# Patient Record
Sex: Female | Born: 2000 | Race: Black or African American | Hispanic: No | Marital: Single | Smoking: Current every day smoker
Health system: Southern US, Community
[De-identification: ages and names within clinical notes are randomized; demographics above are authoritative.]

## PROBLEM LIST (undated history)

## (undated) DIAGNOSIS — N611 Abscess of the breast and nipple: Secondary | ICD-10-CM

## (undated) HISTORY — DX: Abscess of the breast and nipple: N61.1

---
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
## 2022-03-30 ENCOUNTER — Ambulatory Visit (LOCAL_COMMUNITY_HEALTH_CENTER): Payer: Medicaid Other | Admitting: Family Medicine

## 2022-03-30 VITALS — BP 129/74 | HR 97 | Ht 59.0 in | Wt 79.2 lb

## 2022-03-30 DIAGNOSIS — Z309 Encounter for contraceptive management, unspecified: Secondary | ICD-10-CM

## 2022-03-30 DIAGNOSIS — Z3009 Encounter for other general counseling and advice on contraception: Secondary | ICD-10-CM

## 2022-03-30 DIAGNOSIS — Z30013 Encounter for initial prescription of injectable contraceptive: Secondary | ICD-10-CM | POA: Diagnosis not present

## 2022-03-30 DIAGNOSIS — Z0000 Encounter for general adult medical examination without abnormal findings: Secondary | ICD-10-CM

## 2022-03-30 MED ORDER — MEDROXYPROGESTERONE ACETATE 150 MG/ML IM SUSP
150.0000 mg | INTRAMUSCULAR | Status: DC
  Administered 2022-03-30: 150 mg via INTRAMUSCULAR

## 2022-03-30 MED ORDER — MEDROXYPROGESTERONE ACETATE 150 MG/ML IM SUSP: 150.0000 mg | Freq: Once | INTRAMUSCULAR | Status: DC

## 2022-03-30 NOTE — Progress Notes (Signed)
Please see other note with Attestation of FNP documentation.

## 2022-03-30 NOTE — Progress Notes (Signed)
Pt appointment for PE and Depo. Family planning packet provided and contents reviewed. Seen by MD Alvester Morin and FNP Sydnee Levans. Depo administered by the providers.

## 2022-03-30 NOTE — Progress Notes (Addendum)
[facility] DEPARTMENT [facility] [address] Main Number: [phone]    Family Planning Visit- Physical  Subjective:  Rhonda Collins is a 21 y.o.  G0P0000   being seen today for an initial annual visit and to discuss reproductive life planning.  The patient is currently using Hormonal Injection for pregnancy prevention. Patient reports   does not want a pregnancy in the next year.     report they are looking for a method that provides High efficacy at preventing pregnancy  Patient has the following medical conditions has Breast abscess; Underweight BMI=15.6; Short stature; Marijuana use daily; and Nicotine vapor product user on their problem list.  Chief Complaint  Patient presents with   Contraception    PE and Depo    Patient reports to clinic today for PE and depo. Previously on depo but has not been on it since January. Reports unprotected sex yesterday. LMP 03/27/22.    Body mass index is 16 kg/m. - Patient is eligible for diabetes screening based on BMI and age >77?  not applicable HA1C ordered? not applicable  Patient reports 4  partner/s in last year. Desires STI screening?  No - declined.   Has patient been screened once for HCV in the past?  No  No results found for: "HCVAB"  Does the patient have current drug use (including MJ), have a partner with drug use, and/or has been incarcerated since last result? No If yes-- Screen for HCV through [facility]   Does the patient meet criteria for HBV testing? No.  Criteria:  -Household, sexual or needle sharing contact with HBV -History of drug use -HIV positive -Those with known Hep C   Health Maintenance Due  Topic Date Due   COVID-19 Vaccine (1) Never done   HPV VACCINES (1 - 2-dose series) Never done   CHLAMYDIA SCREENING  Never done   Hepatitis C Screening  Never done   TETANUS/TDAP  Never done   INFLUENZA VACCINE  Never done    Review of Systems  All  other systems reviewed and are negative.   The following portions of the patient's history were reviewed and updated as appropriate: allergies, current medications, past family history, past medical history, past social history, past surgical history and problem list. Problem list updated.  Denies any problems or concerns.  See flowsheet for other program required questions.  Objective:   Vitals:   03/30/22 1057  BP: 129/74  Pulse: 97  Weight: 79 lb 3.2 oz (35.9 kg)  Height: 4\' 11"  (1.499 m)    Physical Exam Neurological:     Mental Status: She is alert.  Psychiatric:        Mood and Affect: Affect is flat.   Declined physical exam. Breast exam not indicated. Declined vaginal exam and STI testing.   Assessment and Plan:  Rhonda Collins is a 21 y.o. female presenting to the [facility] Department for an annual wellness/contraceptive visit  1. Family planning Contraception counseling: Reviewed options based on patient desire and reproductive life plan. Patient is interested in Hormonal Injection. This was provided to the patient today.   Risks, benefits, and typical effectiveness rates were reviewed.  Questions were answered.  Written information was also given to the patient to review.    The patient will follow up in  1 years for surveillance.  The patient was told to call with any further questions, or with any concerns about this method of  contraception.  Emphasized use of condoms 100% of the time for STI prevention.  Need for ECP was assessed. Patient reported Unprotected sex within past 72 hours.  Reviewed options and patient declined wanting ECP today. Advised patient to take an at home PT in 2 weeks. Counseled that PT will be done at next depo appointment as backup.  - medroxyPROGESTERone (DEPO-PROVERA) injection 150 mg  2. Well woman exam without gynecological exam Patient declined vaginal exam. No concerns. Breast exam not indicated.  Flat affect  today. Denies depression or mental health concerns.       No future appointments.  Lenice Llamas, [place]

## 2022-03-30 NOTE — Addendum Note (Signed)
Addended by: Lenice Llamas on: 03/30/2022 03:52 PM   Modules accepted: Orders

## 2022-06-21 ENCOUNTER — Ambulatory Visit: Payer: Medicaid Other

## 2022-07-08 ENCOUNTER — Ambulatory Visit: Payer: Medicaid Other | Admitting: Family Medicine

## 2022-08-23 ENCOUNTER — Ambulatory Visit: Payer: Medicaid Other

## 2022-11-23 DIAGNOSIS — O26899 Other specified pregnancy related conditions, unspecified trimester: Secondary | ICD-10-CM | POA: Insufficient documentation

## 2022-11-23 DIAGNOSIS — R1013 Epigastric pain: Secondary | ICD-10-CM | POA: Insufficient documentation

## 2022-11-23 DIAGNOSIS — Z5329 Procedure and treatment not carried out because of patient's decision for other reasons: Secondary | ICD-10-CM | POA: Diagnosis not present

## 2022-11-23 DIAGNOSIS — Z3A00 Weeks of gestation of pregnancy not specified: Secondary | ICD-10-CM | POA: Insufficient documentation

## 2022-11-23 DIAGNOSIS — O219 Vomiting of pregnancy, unspecified: Secondary | ICD-10-CM | POA: Insufficient documentation

## 2022-11-23 LAB — CBC
HCT: 40.5 % (ref 36.0–46.0)
Hemoglobin: 13.5 g/dL (ref 12.0–15.0)
MCH: 26.5 pg (ref 26.0–34.0)
MCHC: 33.3 g/dL (ref 30.0–36.0)
MCV: 79.4 fL — ABNORMAL LOW (ref 80.0–100.0)
Platelets: 335 10*3/uL (ref 150–400)
RBC: 5.1 MIL/uL (ref 3.87–5.11)
RDW: 13.5 % (ref 11.5–15.5)
WBC: 9.4 10*3/uL (ref 4.0–10.5)
nRBC: 0 % (ref 0.0–0.2)

## 2022-11-23 NOTE — ED Notes (Signed)
First Nurse note: pt BIB ems with c/o n/v since lunch time today.  Pt has been vomiting since eating chicken w/BBQ sauce today.  Pt otherwise ambulatory on request, and is A&O x4.    4mg  ODT zofran w/ems VSS w/ems

## 2022-11-23 NOTE — ED Triage Notes (Signed)
Pt states that she has been having n/v all day, upper abd pain with CP and SOB

## 2022-11-24 ENCOUNTER — Emergency Department
Admission: EM | Admit: 2022-11-24 | Discharge: 2022-11-24 | Disposition: A | Discharge status: Home / Self Care | Payer: Medicaid Other | Attending: Emergency Medicine | Admitting: Emergency Medicine

## 2022-11-24 DIAGNOSIS — R112 Nausea with vomiting, unspecified: Secondary | ICD-10-CM

## 2022-11-24 DIAGNOSIS — R1013 Epigastric pain: Secondary | ICD-10-CM

## 2022-11-24 DIAGNOSIS — Z3490 Encounter for supervision of normal pregnancy, unspecified, unspecified trimester: Secondary | ICD-10-CM

## 2022-11-24 LAB — COMPREHENSIVE METABOLIC PANEL
ALT: 15 U/L (ref 0–44)
AST: 28 U/L (ref 15–41)
Albumin: 4.6 g/dL (ref 3.5–5.0)
Alkaline Phosphatase: 70 U/L (ref 38–126)
Anion gap: 12 (ref 5–15)
BUN: 14 mg/dL (ref 6–20)
CO2: 20 mmol/L — ABNORMAL LOW (ref 22–32)
Calcium: 9.8 mg/dL (ref 8.9–10.3)
Chloride: 102 mmol/L (ref 98–111)
Creatinine, Ser: 0.72 mg/dL (ref 0.44–1.00)
GFR, Estimated: >60 mL/min (ref >60)
Glucose, Bld: 128 mg/dL — ABNORMAL HIGH (ref 70–99)
Potassium: 3.7 mmol/L (ref 3.5–5.1)
Sodium: 134 mmol/L — ABNORMAL LOW (ref 135–145)
Total Bilirubin: 1.1 mg/dL (ref 0.3–1.2)
Total Protein: 8.1 g/dL (ref 6.5–8.1)

## 2022-11-24 LAB — URINALYSIS, ROUTINE W REFLEX MICROSCOPIC
Bacteria, UA: NONE SEEN
Bilirubin Urine: NEGATIVE
Glucose, UA: NEGATIVE mg/dL
Hgb urine dipstick: NEGATIVE
Ketones, ur: 80 mg/dL — AB
Leukocytes,Ua: NEGATIVE
Nitrite: NEGATIVE
Protein, ur: 100 mg/dL — AB
Specific Gravity, Urine: 1.024 (ref 1.005–1.030)
pH: 6 (ref 5.0–8.0)

## 2022-11-24 LAB — LIPASE, BLOOD: Lipase: 23 U/L (ref 11–51)

## 2022-11-24 LAB — HCG, QUANTITATIVE, PREGNANCY: hCG, Beta Chain, Quant, S: 7691 m[IU]/mL — ABNORMAL HIGH (ref <5)

## 2022-11-24 LAB — PREGNANCY, URINE: Preg Test, Ur: POSITIVE — AB

## 2022-11-24 MED ORDER — VITAMIN B-6 25 MG PO TABS: 25.0000 mg | ORAL_TABLET | Freq: Four times a day (QID) | ORAL | 0 refills | Status: AC | PRN

## 2022-11-24 MED ORDER — SODIUM CHLORIDE 0.9 % IV BOLUS: 500.0000 mL | Freq: Once | INTRAVENOUS | Status: DC

## 2022-11-24 MED ORDER — ONDANSETRON 4 MG PO TBDP: 4.0000 mg | ORAL_TABLET | Freq: Three times a day (TID) | ORAL | 0 refills | Status: DC | PRN

## 2022-11-24 MED ORDER — FAMOTIDINE IN NACL 20-0.9 MG/50ML-% IV SOLN
20.0000 mg | Freq: Once | INTRAVENOUS | Status: AC
  Administered 2022-11-24: 20 mg via INTRAVENOUS
  Filled 2022-11-24: qty 50

## 2022-11-24 MED ORDER — ONDANSETRON HCL 4 MG/2ML IJ SOLN
4.0000 mg | Freq: Once | INTRAMUSCULAR | Status: AC
  Administered 2022-11-24: 4 mg via INTRAVENOUS
  Filled 2022-11-24: qty 2

## 2022-11-24 MED ORDER — METOCLOPRAMIDE HCL 5 MG/ML IJ SOLN
10.0000 mg | Freq: Once | INTRAMUSCULAR | Status: AC
  Administered 2022-11-24: 10 mg via INTRAVENOUS
  Filled 2022-11-24: qty 2

## 2022-11-24 MED ORDER — DIPHENHYDRAMINE HCL (SLEEP) 25 MG PO TBDP: 25.0000 mg | ORAL_TABLET | Freq: Every evening | ORAL | 0 refills | Status: DC | PRN

## 2022-11-24 MED ORDER — FAMOTIDINE 20 MG PO TABS: 20.0000 mg | ORAL_TABLET | Freq: Every day | ORAL | 0 refills | Status: DC

## 2022-11-24 MED ORDER — SODIUM CHLORIDE 0.9 % IV BOLUS
1000.0000 mL | Freq: Once | INTRAVENOUS | Status: AC
  Administered 2022-11-24: 1000 mL via INTRAVENOUS

## 2022-11-24 NOTE — ED Provider Notes (Signed)
Care assumed of patient from outgoing provider.  See their note for initial history, exam and plan.  Clinical Course as of 11/24/22 0748  Wed Nov 24, 2022  0715 Presents to the emergency department with epigastric pain associated with nausea and vomiting.  History of daily marijuana use but states she no longer has daily marijuana use.  Of multiple episodes of vomiting and no diarrhea.  Given multiple rounds of IV antiemetics and IV fluids.  Labs reassuring.  On reevaluation patient tolerating p.o.  Does not want to eat any graham crackers and states that she will eat whenever she gets home.  Drinking water in the emergency department with no further episodes of emesis.    Pregnancy test came back positive.  Discussed with the patient concern for possible ectopic pregnancy.  Patient said she did not want to stay for further workup.  She was going to leave AGAINST MEDICAL ADVICE.  Was able to state concerns of ectopic pregnancy and pregnancy in the incorrect location.  States that she is going to follow-up with the health department.  Attempted multiple times to discuss staying in the emergency department for further workup.  She expressed understanding and ultimately decided to leave AGAINST MEDICAL ADVICE.  Given return precautions and information to follow-up with the health department. [SM]    Clinical Course User Index [SM] Corena Herter, MD     Corena Herter, MD 11/24/22 [phone]

## 2022-11-24 NOTE — ED Notes (Signed)
Patient actively vomiting at this time. 

## 2022-11-24 NOTE — ED Notes (Signed)
Patient continues to having nausea.  Updated on plan of care

## 2022-11-24 NOTE — ED Notes (Signed)
Patient reports she is unable to provide a urine sample at this time.  States she does not need to urinate.  After speaking to RN, patient covered head with blanket and went back to sleep

## 2022-11-24 NOTE — ED Notes (Signed)
I witnessed Dr Arnoldo Morale explain the risks of not staying for an ultrasound to identify if pt has intrauterine pregnancy. Dr. Arnoldo Morale thoroughly explained risks and outcome of undiagnosed ectopic pregnancy, to include death. Dr. Arnoldo Morale had pt explain back those risks and outcome. Pt still refusing to stay for additional workup.

## 2022-11-24 NOTE — ED Provider Notes (Signed)
[facility] Provider Note    Event Date/Time   First MD Initiated Contact with Patient 11/24/22 0201     (approximate)   History   Nausea   HPI  Rhonda Collins is a 22 y.o. female with no significant PMH who presents with nausea and vomiting since last night, multiple episodes, nonbloody, associated with burning pain to her chest and upper abdomen.  The patient denies any diarrhea.  She denies eating anything out of the ordinary and has not traveled.  She has no sick contacts.  The patient denies any lower abdominal pain.  She has no fever or chills.  I reviewed the past medical records.  The patient's most recent outpatient encounter was on 11/14 of last year with the health department for reproductive life planning.  Physical Exam   Triage Vital Signs: ED Triage Vitals [11/23/22 2315]  Enc Vitals Group     BP 138/85     Pulse Rate (!) 104     Resp 20     Temp 97.7 F (36.5 C)     Temp Source Oral     SpO2 100 %     Weight      Height      Head Circumference      Peak Flow      Pain Score 10     Pain Loc      Pain Edu?      Excl. in GC?     Most recent vital signs: Vitals:   11/23/22 2315 11/24/22 0615  BP: 138/85 123/81  Pulse: (!) 104 100  Resp: 20 15  Temp: 97.7 F (36.5 C) 98.3 F (36.8 C)  SpO2: 100% 100%     General: Awake, no distress.  CV:  Good peripheral perfusion.  Resp:  Normal effort.  Abd:  Soft and nontender.  No distention.  Other:  No jaundice or scleral icterus.   ED Results / Procedures / Treatments   Labs (all labs ordered are listed, but only abnormal results are displayed) Labs Reviewed  COMPREHENSIVE METABOLIC PANEL - Abnormal; Notable for the following components:      Result Value   Sodium 134 (*)    CO2 20 (*)    Glucose, Bld 128 (*)    All other components within normal limits  CBC - Abnormal; Notable for the following components:   MCV 79.4 (*)    All other components within normal  limits  LIPASE, BLOOD  URINALYSIS, ROUTINE W REFLEX MICROSCOPIC  POC URINE PREG, ED     EKG  ED ECG REPORT I, Dionne Bucy, the attending physician, personally viewed and interpreted this ECG.  Date: 11/23/2022 EKG Time: 2327 Rate: 104 Rhythm: normal sinus rhythm QRS Axis: Right axis Intervals: normal ST/T Wave abnormalities: normal Narrative Interpretation: no evidence of acute ischemia; no prior EKG available for comparison    RADIOLOGY    PROCEDURES:  Critical Care performed: No  Procedures   MEDICATIONS ORDERED IN ED: Medications  sodium chloride 0.9 % bolus 1,000 mL (0 mLs Intravenous Stopped 11/24/22 0613)  ondansetron (ZOFRAN) injection 4 mg (4 mg Intravenous Given 11/24/22 0349)  famotidine (PEPCID) IVPB 20 mg premix (0 mg Intravenous Stopped 11/24/22 0612)  metoCLOPramide (REGLAN) injection 10 mg (10 mg Intravenous Given 11/24/22 0654)     IMPRESSION / MDM / ASSESSMENT AND PLAN / ED COURSE  I reviewed the triage vital signs and the nursing notes.  22 year old female with no significant PMH presents  with nausea and vomiting since last night.  The patient also reports upper abdominal and chest pain.  On exam her vital signs are normal except for borderline tachycardia.  The abdomen is soft and nontender.  Differential diagnosis includes, but is not limited to, gastroenteritis, foodborne illness, gastritis, biliary colic, pancreatitis, other hepatobiliary etiology.  Chest pain is consistent with vomiting/stomach acid.  There is no evidence of cardiac etiology or PE.  Patient's presentation is most consistent with acute complicated illness / injury requiring diagnostic workup.  Lab workup is reassuring.  There is no leukocytosis.  LFTs and lipase are normal.  We will give fluids, Zofran, Pepcid for symptomatic treatment and reassess.  ----------------------------------------- 7:06 AM on 11/24/2022 -----------------------------------------   The patient  is feeling somewhat better.  She states that the epigastric and chest pain has completely resolved.  However, she is still nauseous and intermittently dry heaving.  She has not been able to tolerate any p.o.  I have ordered her a dose of Reglan.  I signed her out to the oncoming ED physician Dr. Arnoldo Morale.     FINAL CLINICAL IMPRESSION(S) / ED DIAGNOSES   Final diagnoses:  Nausea and vomiting, unspecified vomiting type  Epigastric pain     Rx / DC Orders   ED Discharge Orders     None        Note:  This document was prepared using Dragon voice recognition software and may include unintentional dictation errors.    Dionne Bucy, MD 11/24/22 [phone]

## 2022-11-24 NOTE — ED Notes (Signed)
Pt asking for phone to call for a ride home. Pt wishing to leave. Dr Arnoldo Morale aware. Pt given phone to call parents. Dr Arnoldo Morale gave pts PO fluids and crackers for PO challenge. Pt reports her nausea has resolved.

## 2022-11-24 NOTE — Discharge Instructions (Addendum)
You are seen in the emergency department with abdominal pain and nausea and vomiting.  You had a pregnancy test that is positive.  It was recommended that you stay in the emergency department to get an ultrasound done to confirm where your pregnancy is located.  If your pregnancy is outside of your uterus you could have something called an ectopic pregnancy which can seriously injure you and result in death if it ruptures.  You did not want to stay for further workup and instead chose to leave AGAINST MEDICAL ADVICE.  If at any point you want to return to the emergency department to complete your workup, please return and we will help complete this workup.  If you have return of abdominal pain it is importantly return to the emergency department.  You are given a prescription for nausea medicines.  You are given information to follow-up with the health department.

## 2022-11-25 ENCOUNTER — Emergency Department: Payer: Medicaid Other

## 2022-11-25 ENCOUNTER — Telehealth: Payer: Self-pay | Admitting: Family Medicine

## 2022-11-25 ENCOUNTER — Emergency Department
Admission: EM | Admit: 2022-11-25 | Discharge: 2022-11-25 | Disposition: A | Discharge status: Home / Self Care | Payer: Medicaid Other | Attending: Student in an Organized Health Care Education/Training Program | Admitting: Student in an Organized Health Care Education/Training Program

## 2022-11-25 ENCOUNTER — Encounter: Payer: Self-pay | Admitting: Emergency Medicine

## 2022-11-25 ENCOUNTER — Other Ambulatory Visit: Payer: Self-pay

## 2022-11-25 DIAGNOSIS — R102 Pelvic and perineal pain: Secondary | ICD-10-CM

## 2022-11-25 DIAGNOSIS — O210 Mild hyperemesis gravidarum: Secondary | ICD-10-CM | POA: Insufficient documentation

## 2022-11-25 DIAGNOSIS — O26891 Other specified pregnancy related conditions, first trimester: Secondary | ICD-10-CM | POA: Diagnosis present

## 2022-11-25 DIAGNOSIS — O209 Hemorrhage in early pregnancy, unspecified: Secondary | ICD-10-CM

## 2022-11-25 DIAGNOSIS — Z3A01 Less than 8 weeks gestation of pregnancy: Secondary | ICD-10-CM | POA: Diagnosis not present

## 2022-11-25 LAB — CBC
HCT: 42.4 % (ref 36.0–46.0)
Hemoglobin: 14 g/dL (ref 12.0–15.0)
MCH: 26.4 pg (ref 26.0–34.0)
MCHC: 33 g/dL (ref 30.0–36.0)
MCV: 80 fL (ref 80.0–100.0)
Platelets: 356 10*3/uL (ref 150–400)
RBC: 5.3 MIL/uL — ABNORMAL HIGH (ref 3.87–5.11)
RDW: 13.8 % (ref 11.5–15.5)
WBC: 13 10*3/uL — ABNORMAL HIGH (ref 4.0–10.5)
nRBC: 0 % (ref 0.0–0.2)

## 2022-11-25 LAB — COMPREHENSIVE METABOLIC PANEL
ALT: 16 U/L (ref 0–44)
AST: 27 U/L (ref 15–41)
Albumin: 4.8 g/dL (ref 3.5–5.0)
Alkaline Phosphatase: 70 U/L (ref 38–126)
Anion gap: 12 (ref 5–15)
BUN: 18 mg/dL (ref 6–20)
CO2: 22 mmol/L (ref 22–32)
Calcium: 9.7 mg/dL (ref 8.9–10.3)
Chloride: 102 mmol/L (ref 98–111)
Creatinine, Ser: 0.71 mg/dL (ref 0.44–1.00)
GFR, Estimated: >60 mL/min (ref >60)
Glucose, Bld: 105 mg/dL — ABNORMAL HIGH (ref 70–99)
Potassium: 3.3 mmol/L — ABNORMAL LOW (ref 3.5–5.1)
Sodium: 136 mmol/L (ref 135–145)
Total Bilirubin: 1 mg/dL (ref 0.3–1.2)
Total Protein: 8.5 g/dL — ABNORMAL HIGH (ref 6.5–8.1)

## 2022-11-25 LAB — HCG, QUANTITATIVE, PREGNANCY: hCG, Beta Chain, Quant, S: 11687 m[IU]/mL — ABNORMAL HIGH (ref <5)

## 2022-11-25 LAB — LIPASE, BLOOD: Lipase: 24 U/L (ref 11–51)

## 2022-11-25 MED ORDER — COMPLETENATE 29-1 MG PO CHEW: 1.0000 | CHEWABLE_TABLET | Freq: Every day | ORAL | 3 refills | Status: AC

## 2022-11-25 MED ORDER — ONDANSETRON 4 MG PO TBDP: 4.0000 mg | ORAL_TABLET | Freq: Three times a day (TID) | ORAL | 0 refills | Status: DC | PRN

## 2022-11-25 MED ORDER — ONDANSETRON 4 MG PO TBDP
4.0000 mg | ORAL_TABLET | Freq: Once | ORAL | Status: AC
  Administered 2022-11-25: 4 mg via ORAL
  Filled 2022-11-25: qty 1

## 2022-11-25 NOTE — Telephone Encounter (Signed)
Call to client to refer her to ED as currently has not established maternity care at [facility]. Chart review indicates currently at the [facility] ED. [facility] clerical scheduled her for UPT tomorrow, but this appt was cancelled as client has positive UPT from [facility] ED yesterday. Left message regarding above on client's voicemail. Also left message that if needed a prenatal care appt to call [phone]. Jossie Ng, RN

## 2022-11-25 NOTE — Telephone Encounter (Signed)
Pt said she went to the hospital + found out she was pregnant but has no documents from the visit. I put her in for a pregnancy test for tomorrow but she stated she's in a lot of pain + doesn't know whether she needs to go back to the emergency room. I let her know I wasn't able to speak on that but would put in a phone note in.

## 2022-11-25 NOTE — ED Provider Notes (Signed)
[facility] Emergency Department Provider Note     Event Date/Time   First MD Initiated Contact with Patient 11/25/22 1137     (approximate)   History   Abdominal Pain   HPI  Rhonda Collins is a 22 y.o. female G1P0, returns to the ED for further evaluation of abdominal pain.  Patient was evaluated in the ED yesterday, left AMA due to being hungry.  Patient declined ultrasound yesterday to confirm that she had an IUP versus an ectopic pregnancy.  She is unclear of the current gestation of her pregnancy.  She denies any vaginal bleeding or pregnancy related complaints at this time.  Physical Exam   Triage Vital Signs: ED Triage Vitals  Encounter Vitals Group     BP 11/25/22 1058 (!) 124/91     Systolic BP Percentile --      Diastolic BP Percentile --      Pulse Rate 11/25/22 1058 98     Resp 11/25/22 1058 18     Temp 11/25/22 1058 98.3 F (36.8 C)     Temp Source 11/25/22 1058 Oral     SpO2 11/25/22 1058 94 %     Weight --      Height --      Head Circumference --      Peak Flow --      Pain Score 11/25/22 1059 10     Pain Loc --      Pain Education --      Exclude from Growth Chart --     Most recent vital signs: Vitals:   11/25/22 1058 11/25/22 1334  BP: (!) 124/91 131/83  Pulse: 98 88  Resp: 18 17  Temp: 98.3 F (36.8 C)   SpO2: 94% 100%    General Awake, no distress. NAD CV:  Good peripheral perfusion.  RESP:  Normal effort.  ABD:  No distention.  GU:  deferred   ED Results / Procedures / Treatments   Labs (all labs ordered are listed, but only abnormal results are displayed) Labs Reviewed  COMPREHENSIVE METABOLIC PANEL - Abnormal; Notable for the following components:      Result Value   Potassium 3.3 (*)    Glucose, Bld 105 (*)    Total Protein 8.5 (*)    All other components within normal limits  CBC - Abnormal; Notable for the following components:   WBC 13.0 (*)    RBC 5.30 (*)    All other components  within normal limits  HCG, QUANTITATIVE, PREGNANCY - Abnormal; Notable for the following components:   hCG, Beta Chain, Quant, S 11,687 (*)    All other components within normal limits  LIPASE, BLOOD     EKG    RADIOLOGY  I personally viewed and evaluated these images as part of my medical decision making, as well as reviewing the written report by the radiologist.  ED Provider Interpretation: single IUP gestational sac and yolk sac. No fetal pole  US OB LESS THAN 14 WEEKS WITH OB TRANSVAGINAL  Result Date: 11/25/2022 CLINICAL DATA:  Vaginal bleeding and pelvic pain EXAM: OBSTETRIC <14 WK [place] AND TRANSVAGINAL OB US TECHNIQUE: Both transabdominal and transvaginal ultrasound examinations were performed for complete evaluation of the gestation as well as the maternal uterus, adnexal regions, and pelvic cul-de-sac. Transvaginal technique was performed to assess early pregnancy. COMPARISON:  None Available. FINDINGS: Intrauterine gestational sac: Single Yolk sac:  Visualized. Embryo:  Not Visualized. Cardiac Activity: Not Visualized. Heart  Rate: NA MSD: 7.6 mm   5 w   4 d CRL: N/A Subchorionic hemorrhage:  None visualized. Maternal uterus/adnexae: Normal right ovary contains the corpus luteum. Left ovary is not seen. Small volume pelvic free fluid. IMPRESSION: Probable early intrauterine gestational sac and yolk sac, but no fetal pole or cardiac activity yet visualized. Recommend follow-up quantitative B-HCG levels and follow-up US in 14 days to assess viability. This recommendation follows SRU consensus guidelines: Diagnostic Criteria for Nonviable Pregnancy Early in the First Trimester. Malva Limes Med 2013; 161:0960-45. Electronically Signed   By: Agustin Cree M.D.   On: 11/25/2022 13:01     PROCEDURES:  Critical Care performed: No  Procedures   MEDICATIONS ORDERED IN ED: Medications  ondansetron (ZOFRAN-ODT) disintegrating tablet 4 mg (4 mg Oral Given 11/25/22 1344)     IMPRESSION / MDM /  ASSESSMENT AND PLAN / ED COURSE  I reviewed the triage vital signs and the nursing notes.                              Differential diagnosis includes, but is not limited to, ovarian cyst, ovarian torsion, acute appendicitis, diverticulitis, urinary tract infection/pyelonephritis, bowel obstruction, colitis, renal colic, gastroenteritis, hernia, fibroids, endometriosis, pregnancy related pain including ectopic pregnancy, etc.  Patient's presentation is most consistent with acute complicated illness / injury requiring diagnostic workup.  Patient reported to the ED to complete a visit she began yesterday, before she left AMA.  Patient presents in no acute distress, and concern with ongoing nausea and intermittent emesis.  Was evaluated for complaints in the ED, found to have reassuring exam and workup overall.  Beta quant is greater than 11,000.  No signs of acute leukocytosis or critical anemia.  Patient was unable to provide a urine sample today, but UA from yesterday did reveal ketonuria/proteinuria without bacteriuria.  Patient's diagnosis is consistent with early single IUP with sac w/o fetal pole. Patient will be discharged home with prescriptions for Zofran and Prenatal MVI. Patient is to follow up with [facility] OB as needed or otherwise directed. Patient is given ED precautions to return to the ED for any worsening or new symptoms.  Clinical Course as of 11/25/22 1902  Thu Nov 25, 2022  1336 HCG, Newman Nickels(!): [phone] [JM]    Clinical Course User Index [JM] Lineth Thielke, Charlesetta Ivory, PA-C    FINAL CLINICAL IMPRESSION(S) / ED DIAGNOSES   Final diagnoses:  Hyperemesis affecting pregnancy, antepartum  Less than [redacted] weeks gestation of pregnancy     Rx / DC Orders   ED Discharge Orders          Ordered    ondansetron (ZOFRAN-ODT) 4 MG disintegrating tablet  Every 8 hours PRN        11/25/22 1344    prenatal vitamin w/FE, FA (NATACHEW) 29-1 MG CHEW chewable tablet  Daily         11/25/22 1344             Note:  This document was prepared using Dragon voice recognition software and may include unintentional dictation errors.    Lissa Hoard, PA-C 11/25/22 1903    Willy Eddy, MD 11/27/22 1224

## 2022-11-25 NOTE — ED Notes (Signed)
Pt reports upper and lower medial abdominal "pressure"; pt reports nausea; reports yesterday was told she is pregnant; pt reports appetite is good but afraid to eat because she doesn't want to get sick and vomit again. Pt's skin dry, resp reg/unlabored and laying calmly on stretcher with blanket. Visitor at bedside.

## 2022-11-25 NOTE — Discharge Instructions (Addendum)
Your exam and labs overall reassuring.  Your ultrasound confirms a complete pregnancy measuring approximately 5 weeks.  You should follow-up with your OB provider for ongoing blood pregnancy hormone levels and repeat ultrasound in 14 days.  Take the prenatal vitamins and antinausea medicine as directed.

## 2022-11-25 NOTE — ED Notes (Signed)
Lavender, light green, and pink top sent to lab at this time.

## 2022-11-25 NOTE — ED Triage Notes (Signed)
Patient to ED via [facility] for abd pain. States she is pregnant but unsure how far along. States she left AMA due to being hungry yesterday.

## 2022-11-30 ENCOUNTER — Other Ambulatory Visit: Payer: Self-pay

## 2022-11-30 ENCOUNTER — Emergency Department
Admission: EM | Admit: 2022-11-30 | Discharge: 2022-11-30 | Disposition: A | Discharge status: Home / Self Care | Payer: Medicaid Other | Attending: Emergency Medicine | Admitting: Emergency Medicine

## 2022-11-30 ENCOUNTER — Encounter: Payer: Self-pay | Admitting: Emergency Medicine

## 2022-11-30 DIAGNOSIS — O209 Hemorrhage in early pregnancy, unspecified: Secondary | ICD-10-CM | POA: Diagnosis not present

## 2022-11-30 DIAGNOSIS — Z3A00 Weeks of gestation of pregnancy not specified: Secondary | ICD-10-CM | POA: Insufficient documentation

## 2022-11-30 DIAGNOSIS — O4690 Antepartum hemorrhage, unspecified, unspecified trimester: Secondary | ICD-10-CM

## 2022-11-30 LAB — CHLAMYDIA/NGC RT PCR (ARMC ONLY)
Chlamydia Tr: NOT DETECTED
N gonorrhoeae: NOT DETECTED

## 2022-11-30 LAB — URINALYSIS, ROUTINE W REFLEX MICROSCOPIC
Bilirubin Urine: NEGATIVE
Glucose, UA: NEGATIVE mg/dL
Ketones, ur: 20 mg/dL — AB
Nitrite: NEGATIVE
Protein, ur: 30 mg/dL — AB
Specific Gravity, Urine: 1.027 (ref 1.005–1.030)
pH: 6 (ref 5.0–8.0)

## 2022-11-30 LAB — BASIC METABOLIC PANEL
Anion gap: 9 (ref 5–15)
BUN: 10 mg/dL (ref 6–20)
CO2: 23 mmol/L (ref 22–32)
Calcium: 8.7 mg/dL — ABNORMAL LOW (ref 8.9–10.3)
Chloride: 99 mmol/L (ref 98–111)
Creatinine, Ser: 0.77 mg/dL (ref 0.44–1.00)
GFR, Estimated: >60 mL/min (ref >60)
Glucose, Bld: 100 mg/dL — ABNORMAL HIGH (ref 70–99)
Potassium: 3.4 mmol/L — ABNORMAL LOW (ref 3.5–5.1)
Sodium: 131 mmol/L — ABNORMAL LOW (ref 135–145)

## 2022-11-30 LAB — CBC
HCT: 38.3 % (ref 36.0–46.0)
Hemoglobin: 13 g/dL (ref 12.0–15.0)
MCH: 26.8 pg (ref 26.0–34.0)
MCHC: 33.9 g/dL (ref 30.0–36.0)
MCV: 79 fL — ABNORMAL LOW (ref 80.0–100.0)
Platelets: 299 10*3/uL (ref 150–400)
RBC: 4.85 MIL/uL (ref 3.87–5.11)
RDW: 13 % (ref 11.5–15.5)
WBC: 10.8 10*3/uL — ABNORMAL HIGH (ref 4.0–10.5)
nRBC: 0 % (ref 0.0–0.2)

## 2022-11-30 LAB — WET PREP, GENITAL
Clue Cells Wet Prep HPF POC: NONE SEEN
Sperm: NONE SEEN
Trich, Wet Prep: NONE SEEN
WBC, Wet Prep HPF POC: >=10 — AB (ref <10)
Yeast Wet Prep HPF POC: NONE SEEN

## 2022-11-30 LAB — HCG, QUANTITATIVE, PREGNANCY: hCG, Beta Chain, Quant, S: 47267 m[IU]/mL — ABNORMAL HIGH (ref <5)

## 2022-11-30 NOTE — ED Triage Notes (Signed)
Patient to ED via [facility] for vaginal bleeding. Patient states she went to the restroom around 4pm and notice brown colored discharge/blood when she wiped. Approx 5 weeks pregnancy. Seen here on 7/11.

## 2022-11-30 NOTE — Discharge Instructions (Addendum)
Your exam and labs are again normal and reassuring.  Your beta Sharene Butters is now greater than 47,000.  You should follow-up with [facility] for repeat ultrasound in 7 to 10 days.  Return to the ED for bright red bleeding, pelvic cramping, or uncontrolled nausea/vomiting.

## 2022-11-30 NOTE — ED Provider Notes (Signed)
[facility] Emergency Department Provider Note     Event Date/Time   First MD Initiated Contact with Patient 11/30/22 1720     (approximate)   History   Vaginal Bleeding   HPI  Rhonda Collins is a 22 y.o. female G1P0 returns to the ED for evaluation of some scant brown vaginal discharge reported this afternoon at about 4 PM.  Was while she was going to the restroom she noted the discharge.  Patient denies any other complaints including nausea, vomiting, diarrhea.  She denies any pelvic cramping, dysuria or bright red blood per vagina.   Physical Exam   Triage Vital Signs: ED Triage Vitals [11/30/22 1709]  Encounter Vitals Group     BP 139/75     Systolic BP Percentile      Diastolic BP Percentile      Pulse Rate 88     Resp 18     Temp 98.5 F (36.9 C)     Temp Source Oral     SpO2 100 %     Weight 72 lb (32.7 kg)     Height 4\' 11"  (1.499 m)     Head Circumference      Peak Flow      Pain Score 0     Pain Loc      Pain Education      Exclude from Growth Chart     Most recent vital signs: Vitals:   11/30/22 1709  BP: 139/75  Pulse: 88  Resp: 18  Temp: 98.5 F (36.9 C)  SpO2: 100%    General Awake, no distress. NAD CV:  Good peripheral perfusion.  RESP:  Normal effort.  ABD:  No distention.  GU:  Normal external genitalia.  Cervix is close with a scant brownish discharge noted in the vault and at the os.  No CVA tenderness or adnexal masses appreciated.   ED Results / Procedures / Treatments   Labs (all labs ordered are listed, but only abnormal results are displayed) Labs Reviewed  WET PREP, GENITAL - Abnormal; Notable for the following components:      Result Value   WBC, Wet Prep HPF POC >=10 (*)    All other components within normal limits  CBC - Abnormal; Notable for the following components:   WBC 10.8 (*)    MCV 79.0 (*)    All other components within normal limits  BASIC METABOLIC PANEL - Abnormal;  Notable for the following components:   Sodium 131 (*)    Potassium 3.4 (*)    Glucose, Bld 100 (*)    Calcium 8.7 (*)    All other components within normal limits  HCG, QUANTITATIVE, PREGNANCY - Abnormal; Notable for the following components:   hCG, Beta Chain, Quant, S 47,267 (*)    All other components within normal limits  CHLAMYDIA/NGC RT PCR ([facility] ONLY)            URINALYSIS, ROUTINE W REFLEX MICROSCOPIC  POC URINE PREG, ED     EKG   RADIOLOGY  No results found.   PROCEDURES:  Critical Care performed: No  Procedures   MEDICATIONS ORDERED IN ED: Medications - No data to display   IMPRESSION / MDM / ASSESSMENT AND PLAN / ED COURSE  I reviewed the triage vital signs and the nursing notes.  Differential diagnosis includes, but is not limited to, threatened miscarriage, incomplete miscarriage, normal bleeding from an early trimester pregnancy, ectopic pregnancy, , blighted ovum, vaginal/cervical trauma, subchorionic hemorrhage/hematoma, etc.  Patient's presentation is most consistent with acute complicated illness / injury requiring diagnostic workup.  Patient's diagnosis is consistent with stable IUP with reassuring quant at greater than 47,000.  Patient with only scant brown discharge noted per vagina.  Her pelvic exam is also reassuring as it shows closed cervical os.  Patient will be discharged home with actions to continue with her prenatal vitamin.  Patient is to follow up with [facility] as scheduled, as needed or otherwise directed. Patient is given ED precautions to return to the ED for any worsening or new symptoms.  Again reminded that she needs a repeat ultrasound in 7 to 10 days to confirm progression of the pregnancy however, her increased quant indicates a well developing IUP.   FINAL CLINICAL IMPRESSION(S) / ED DIAGNOSES   Final diagnoses:  Vaginal bleeding in pregnancy     Rx / DC Orders   ED Discharge Orders     None         Note:  This document was prepared using Dragon voice recognition software and may include unintentional dictation errors.    Lissa Hoard, PA-C 11/30/22 2016    Trinna Post, MD 12/06/22 [phone]

## 2022-12-29 ENCOUNTER — Telehealth: Payer: Self-pay

## 2022-12-29 NOTE — Telephone Encounter (Signed)
Phone call to patient to assess for receiving prenatal care at another provider due to RN researching chart for scheduled new OB appt here on 12/30/22. Per female answering phone which did not identify herself stated "she isn't with me right now." RN needed to inform patient to schedule or continue [facility] at current provider due to per 12/23/22 [place] patient has a twin pregnancy. RN requested and provided call back number for patient to return our call. Tawny Hopping, RN

## 2023-01-10 DIAGNOSIS — O0992 Supervision of high risk pregnancy, unspecified, second trimester: Secondary | ICD-10-CM | POA: Insufficient documentation

## 2023-01-18 ENCOUNTER — Telehealth: Payer: Self-pay

## 2023-01-18 ENCOUNTER — Other Ambulatory Visit: Payer: Self-pay | Admitting: Certified Nurse Midwife

## 2023-01-18 DIAGNOSIS — Z363 Encounter for antenatal screening for malformations: Secondary | ICD-10-CM

## 2023-01-26 ENCOUNTER — Telehealth: Payer: Self-pay

## 2023-02-08 DIAGNOSIS — O30032 Twin pregnancy, monochorionic/diamniotic, second trimester: Secondary | ICD-10-CM | POA: Diagnosis present

## 2023-02-25 DIAGNOSIS — O30009 Twin pregnancy, unspecified number of placenta and unspecified number of amniotic sacs, unspecified trimester: Secondary | ICD-10-CM | POA: Insufficient documentation

## 2023-02-28 ENCOUNTER — Emergency Department: Payer: Medicaid Other

## 2023-02-28 ENCOUNTER — Observation Stay
Admission: EM | Admit: 2023-02-28 | Discharge: 2023-03-02 | Disposition: A | Discharge status: Home / Self Care | Payer: Medicaid Other

## 2023-02-28 ENCOUNTER — Other Ambulatory Visit: Payer: Self-pay

## 2023-02-28 DIAGNOSIS — O99332 Smoking (tobacco) complicating pregnancy, second trimester: Secondary | ICD-10-CM | POA: Insufficient documentation

## 2023-02-28 DIAGNOSIS — O23592 Infection of other part of genital tract in pregnancy, second trimester: Secondary | ICD-10-CM | POA: Insufficient documentation

## 2023-02-28 DIAGNOSIS — Z3A19 19 weeks gestation of pregnancy: Secondary | ICD-10-CM | POA: Diagnosis not present

## 2023-02-28 DIAGNOSIS — Z79899 Other long term (current) drug therapy: Secondary | ICD-10-CM | POA: Insufficient documentation

## 2023-02-28 DIAGNOSIS — O99012 Anemia complicating pregnancy, second trimester: Secondary | ICD-10-CM | POA: Insufficient documentation

## 2023-02-28 DIAGNOSIS — A5901 Trichomonal vulvovaginitis: Secondary | ICD-10-CM

## 2023-02-28 DIAGNOSIS — F17290 Nicotine dependence, other tobacco product, uncomplicated: Secondary | ICD-10-CM | POA: Diagnosis not present

## 2023-02-28 DIAGNOSIS — A599 Trichomoniasis, unspecified: Secondary | ICD-10-CM | POA: Diagnosis not present

## 2023-02-28 DIAGNOSIS — O4192X0 Disorder of amniotic fluid and membranes, unspecified, second trimester, not applicable or unspecified: Principal | ICD-10-CM | POA: Insufficient documentation

## 2023-02-28 DIAGNOSIS — O42912 Preterm premature rupture of membranes, unspecified as to length of time between rupture and onset of labor, second trimester: Secondary | ICD-10-CM

## 2023-02-28 DIAGNOSIS — O4290 Premature rupture of membranes, unspecified as to length of time between rupture and onset of labor, unspecified weeks of gestation: Principal | ICD-10-CM | POA: Diagnosis present

## 2023-02-28 DIAGNOSIS — O30032 Twin pregnancy, monochorionic/diamniotic, second trimester: Secondary | ICD-10-CM | POA: Diagnosis not present

## 2023-02-28 MED ORDER — SODIUM CHLORIDE 0.9 % IV BOLUS (SEPSIS)
1000.0000 mL | Freq: Once | INTRAVENOUS | Status: AC
  Administered 2023-03-01: 1000 mL via INTRAVENOUS

## 2023-02-28 MED ORDER — ACETAMINOPHEN 500 MG PO TABS
1000.0000 mg | ORAL_TABLET | Freq: Once | ORAL | Status: AC
  Administered 2023-03-01: 1000 mg via ORAL
  Filled 2023-02-28: qty 2

## 2023-02-28 NOTE — ED Triage Notes (Addendum)
Pt states she is pregnant with twins and will be [redacted] weeks pregnant tomorrow, tonight was laying down and felt a large rush of fluid. Pt states fluid was clear, pt states at the time she was having intermittent discomfort in her vagina. Pt denies urge to push at this time. States she is having some abd discomfort.

## 2023-02-28 NOTE — ED Notes (Signed)
Auscultated FHT in triage with doppler.

## 2023-02-28 NOTE — ED Provider Notes (Signed)
[facility] Provider Note    Event Date/Time   First MD Initiated Contact with Patient 02/28/23 2313     (approximate)   History   pregnancy problem   HPI  Rhonda Collins is a 22 y.o. female G1, P0 approximately 18 weeks and 6 days pregnant mono Di twin pregnancy who presents to the emergency department with concerns for PPROM.  States she was going to sleep tonight and she had a gush of clear fluid.  She did not feel a pop or any discomfort.  She is starting to have some lower abdominal discomfort and back pain since getting to the ER.  She has been treated for trichomonas during this pregnancy.  No other complications.  She has not seen any vaginal bleeding.  She cannot recall the last time she has had sexual intercourse.  States it has been more than 48 hours ago.  She is followed by [facility] clinic.   History provided by patient, mother.    Past Medical History:  Diagnosis Date   Breast abscess    Marijuana use daily 07/15/2020    Past Surgical History:  Procedure Laterality Date   ASPIRATION OF ABSCESS  10/08/2016        MEDICATIONS:  Prior to Admission medications   Medication Sig Start Date End Date Taking? Authorizing Provider  diphenhydrAMINE HCl, Sleep, 25 MG TBDP Take 1 tablet (25 mg total) by mouth at bedtime as needed (nausea). 11/24/22 12/24/22  Corena Herter, MD  ondansetron (ZOFRAN-ODT) 4 MG disintegrating tablet Take 1 tablet (4 mg total) by mouth every 8 (eight) hours as needed for nausea or vomiting. 11/25/22   Menshew, Charlesetta Ivory, PA-C  prenatal vitamin w/FE, FA (NATACHEW) 29-1 MG CHEW chewable tablet Chew 1 tablet by mouth daily at 12 noon. 11/25/22   Menshew, Charlesetta Ivory, PA-C    Physical Exam   Triage Vital Signs: ED Triage Vitals  Encounter Vitals Group     BP 02/28/23 2257 (!) 145/77     Systolic BP Percentile --      Diastolic BP Percentile --      Pulse Rate 02/28/23 2257 (!) 110     Resp 02/28/23 2257 20      Temp 02/28/23 2257 98.1 F (36.7 C)     Temp src --      SpO2 02/28/23 2257 100 %     Weight 02/28/23 2305 100 lb (45.4 kg)     Height 02/28/23 2305 4\' 11"  (1.499 m)     Head Circumference --      Peak Flow --      Pain Score 02/28/23 2305 0     Pain Loc --      Pain Education --      Exclude from Growth Chart --     Most recent vital signs: Vitals:   03/01/23 0115 03/01/23 0238  BP: 128/78 128/78  Pulse: 87 87  Resp:  18  Temp:  98.7 F (37.1 C)  SpO2:  100%    CONSTITUTIONAL: Alert, responds appropriately to questions. Well-appearing; well-nourished HEAD: Normocephalic, atraumatic EYES: Conjunctivae clear, pupils appear equal, sclera nonicteric ENT: normal nose; moist mucous membranes NECK: Supple, normal ROM CARD: RRR; S1 and S2 appreciated RESP: Normal chest excursion without splinting or tachypnea; breath sounds clear and equal bilaterally; no wheezes, no rhonchi, no rales, no hypoxia or respiratory distress, speaking full sentences ABD/GI: Gravid uterus; soft, non-tender, no rebound, no guarding, no peritoneal signs GU:  Normal external genitalia. No lesions, rashes noted.  Sterile speculum exam performed.  Patient has no significant fluid in the posterior vaginal vault but does have white discharge noted.  Cervix is friable but no other bleeding noted.  No pooling of fluid in the vaginal vault.  No fluid coming from the cervical os.  Bimanual exam deferred.  Chaperone present for exam. BACK: The back appears normal EXT: Normal ROM in all joints; no deformity noted, no edema SKIN: Normal color for age and race; warm; no rash on exposed skin NEURO: Moves all extremities equally, normal speech PSYCH: The patient's mood and manner are appropriate.   ED Results / Procedures / Treatments   LABS: (all labs ordered are listed, but only abnormal results are displayed) Labs Reviewed  WET PREP, GENITAL - Abnormal; Notable for the following components:      Result Value    Trich, Wet Prep PRESENT (*)    All other components within normal limits  URINALYSIS, ROUTINE W REFLEX MICROSCOPIC - Abnormal; Notable for the following components:   Color, Urine STRAW (*)    APPearance CLEAR (*)    Hgb urine dipstick MODERATE (*)    Leukocytes,Ua TRACE (*)    Bacteria, UA RARE (*)    All other components within normal limits  CHLAMYDIA/NGC RT PCR ([facility] ONLY)            URINE CULTURE  RUPTURE OF MEMBRANE (ROM)PLUS     EKG:  EKG Interpretation Date/Time:    Ventricular Rate:    PR Interval:    QRS Duration:    QT Interval:    QTC Calculation:   R Axis:      Text Interpretation:           RADIOLOGY: My personal review and interpretation of imaging: OB ultrasound shows no normal fetal heart tones and the presence of amniotic fluid.  I have personally reviewed all radiology reports.   US OB Limited  Result Date: 03/01/2023 CLINICAL DATA:  Amniotic fluid check. EXAM: LIMITED OBSTETRIC ULTRASOUND FINDINGS: Number of Fetuses:  2 Separating Membrane: Not Visualized TWIN 1 Heart Rate:  143 bpm Movement: Yes Presentation: Cephalic Placental Location: Anterior Previa: No Amniotic Fluid (Subjective): Decreased (largest pocket measures less than 1.0 cm) BPD:  4.7cm 20w 2d TWIN 2 Heart Rate:  157 bpm Movement: Yes Presentation: Breech Placental Location: Anterior Previa: No Amniotic Fluid (Subjective): Increased (largest pocket measures 6.9 cm) BPD:  4.5cm 19w 4d MATERNAL FINDINGS: Cervix:  Appears closed. Uterus/Adnexae: No abnormality visualized. IMPRESSION: 1. Viable intrauterine twin pregnancy, as described above, at approximately 20 weeks and 2 days gestation for Twin A and 19 weeks and 4 days gestation for Twin B. 2. Decreased amniotic fluid associated with Twin A and increased amniotic fluid associated with Twin B. This exam is performed on an emergent basis and does not comprehensively evaluate fetal size, dating, or anatomy; follow-up complete OB US should be  considered if further fetal assessment is warranted Electronically Signed   By: Aram Candela M.D.   On: 03/01/2023 03:19   US OB Limited  Result Date: 03/01/2023 CLINICAL DATA:  Amniotic fluid check. EXAM: LIMITED OBSTETRIC ULTRASOUND FINDINGS: Number of Fetuses:  2 Separating Membrane: Not Visualized TWIN 1 Heart Rate:  143 bpm Movement: Yes Presentation: Cephalic Placental Location: Anterior Previa: No Amniotic Fluid (Subjective): Decreased (largest pocket measures less than 1.0 cm) BPD:  4.7cm 20w 2d TWIN 2 Heart Rate:  157 bpm Movement: Yes Presentation: Breech Placental Location: Anterior Previa:  No Amniotic Fluid (Subjective): Increased (largest pocket measures 6.9 cm) BPD:  4.5cm 19w 4d MATERNAL FINDINGS: Cervix:  Appears closed. Uterus/Adnexae: No abnormality visualized. IMPRESSION: 1. Viable intrauterine twin pregnancy, as described above, at approximately 20 weeks and 2 days gestation for Twin A and 19 weeks and 4 days gestation for Twin B. 2. Decreased amniotic fluid associated with Twin A and increased amniotic fluid associated with Twin B. This exam is performed on an emergent basis and does not comprehensively evaluate fetal size, dating, or anatomy; follow-up complete OB US should be considered if further fetal assessment is warranted Electronically Signed   By: Aram Candela M.D.   On: 03/01/2023 03:19     PROCEDURES:  Critical Care performed: Yes, see critical care procedure note(s)   CRITICAL CARE Performed by: Baxter Hire Gudrun Axe   Total critical care time: 40 minutes  Critical care time was exclusive of separately billable procedures and treating other patients.  Critical care was necessary to treat or prevent imminent or life-threatening deterioration.  Critical care was time spent personally by me on the following activities: development of treatment plan with patient and/or surrogate as well as nursing, discussions with consultants, evaluation of patient's response to  treatment, examination of patient, obtaining history from patient or surrogate, ordering and performing treatments and interventions, ordering and review of laboratory studies, ordering and review of radiographic studies, pulse oximetry and re-evaluation of patient's condition.   Procedures    IMPRESSION / MDM / ASSESSMENT AND PLAN / ED COURSE  I reviewed the triage vital signs and the nursing notes.    Patient here with concerns for leaking amniotic fluid.    DIFFERENTIAL DIAGNOSIS (includes but not limited to):   Preterm premature rupture of membranes, STI, UTI, premature labor   Patient's presentation is most consistent with acute presentation with potential threat to life or bodily function.   PLAN: Will perform testing to check for amniotic fluid as well as STIs, urine to rule out UTI.  Will give IV fluids, Tylenol.  She is complaining of abdominal discomfort and back pain but clinically no obvious signs of contraction.  Will perform sterile speculum exam.  OB is aware of patient.   MEDICATIONS GIVEN IN ED: Medications  acetaminophen (TYLENOL) tablet 650 mg (has no administration in time range)  zolpidem (AMBIEN) tablet 5 mg (has no administration in time range)  docusate sodium (COLACE) capsule 100 mg (has no administration in time range)  calcium carbonate (TUMS - dosed in mg elemental calcium) chewable tablet 400 mg of elemental calcium (has no administration in time range)  prenatal multivitamin tablet 1 tablet (has no administration in time range)  sodium chloride 0.9 % bolus 1,000 mL (0 mLs Intravenous Stopped 03/01/23 0146)  acetaminophen (TYLENOL) tablet 1,000 mg (1,000 mg Oral Given 03/01/23 0115)  metroNIDAZOLE (FLAGYL) tablet 2,000 mg (2,000 mg Oral Given 03/01/23 0116)     ED COURSE: Patient's rupture of membrane test is positive but she did have a small amount of vaginal bleeding on exam from a friable cervix and is positive for trichomonas which could alter  the testing.  Urine does show red blood cells and white blood cells but this could be coming from her vaginal discharge from trichomonas.  Will add on a urine culture.  Will give 2 g of Flagyl now.  Gonorrhea and Chlamydia negative.  OB ultrasound pending.  Discussed with Donato Schultz, midwife on-call for [facility] clinic.  She will see patient in the ED.   Duwayne Heck has  performed sterile speculum exam as well.  Negative ferning under microscope.  Normal pH.  Will follow-up on OB ultrasound read for further disposition.   Ultrasound unit interpreted by myself and the radiologist and shows a viable intrauterine twin pregnancy.  There is decreased amniotic fluid associated with twin A and increased amniotic fluid associated with twin B.  Discussed with Donato Schultz who discussed case with Dr. Feliberto Gottron and they recommend admission to labor and delivery.  They recommend that she empties her bladder here in the ED prior to transfer to labor and delivery and that she will be on strict bedrest for 4 to 6 hours for repeat sterile speculum exam and repeat testing.  Patient is comfortable with this plan.  She is not having any further abdominal or back discomfort since getting IV fluids, Tylenol.  CONSULTS: Consulted OB/GYN on-call.  They will admit patient to labor and delivery for monitoring.   OUTSIDE RECORDS REVIEWED: Reviewed last OB/GYN note on 02/08/2023.       FINAL CLINICAL IMPRESSION(S) / ED DIAGNOSES   Final diagnoses:  Trichomonas vaginitis  Amniotic fluid leaking     Rx / DC Orders   ED Discharge Orders     None        Note:  This document was prepared using Dragon voice recognition software and may include unintentional dictation errors.   Jenaro Souder, Layla Maw, DO 03/01/23 [phone]

## 2023-03-01 DIAGNOSIS — O4290 Premature rupture of membranes, unspecified as to length of time between rupture and onset of labor, unspecified weeks of gestation: Principal | ICD-10-CM | POA: Diagnosis present

## 2023-03-01 LAB — CBC WITH DIFFERENTIAL/PLATELET
Abs Immature Granulocytes: 0.08 K/uL — ABNORMAL HIGH (ref 0.00–0.07)
Basophils Absolute: 0 K/uL (ref 0.0–0.1)
Basophils Relative: 0 %
Eosinophils Absolute: 0.2 K/uL (ref 0.0–0.5)
Eosinophils Relative: 2 %
HCT: 25.9 % — ABNORMAL LOW (ref 36.0–46.0)
Hemoglobin: 8.6 g/dL — ABNORMAL LOW (ref 12.0–15.0)
Immature Granulocytes: 1 %
Lymphocytes Relative: 27 %
Lymphs Abs: 2.8 K/uL (ref 0.7–4.0)
MCH: 27.2 pg (ref 26.0–34.0)
MCHC: 33.2 g/dL (ref 30.0–36.0)
MCV: 82 fL (ref 80.0–100.0)
Monocytes Absolute: 0.7 K/uL (ref 0.1–1.0)
Monocytes Relative: 7 %
Neutro Abs: 6.4 K/uL (ref 1.7–7.7)
Neutrophils Relative %: 63 %
Platelets: 253 K/uL (ref 150–400)
RBC: 3.16 MIL/uL — ABNORMAL LOW (ref 3.87–5.11)
RDW: 13.2 % (ref 11.5–15.5)
WBC: 10.2 K/uL (ref 4.0–10.5)
nRBC: 0 % (ref 0.0–0.2)

## 2023-03-01 LAB — TYPE AND SCREEN
ABO/RH(D): O POS
Antibody Screen: NEGATIVE

## 2023-03-01 LAB — IRON AND TIBC
Iron: 53 ug/dL (ref 28–170)
Saturation Ratios: 10 % — ABNORMAL LOW (ref 10.4–31.8)
TIBC: 525 ug/dL — ABNORMAL HIGH (ref 250–450)
UIBC: 472 ug/dL

## 2023-03-01 LAB — COMPREHENSIVE METABOLIC PANEL
ALT: 15 U/L (ref 0–44)
AST: 23 U/L (ref 15–41)
Albumin: 2.7 g/dL — ABNORMAL LOW (ref 3.5–5.0)
Alkaline Phosphatase: 90 U/L (ref 38–126)
Anion gap: 6 (ref 5–15)
BUN: 8 mg/dL (ref 6–20)
CO2: 23 mmol/L (ref 22–32)
Calcium: 8.3 mg/dL — ABNORMAL LOW (ref 8.9–10.3)
Chloride: 105 mmol/L (ref 98–111)
Creatinine, Ser: 0.65 mg/dL (ref 0.44–1.00)
GFR, Estimated: >60 mL/min (ref >60)
Glucose, Bld: 76 mg/dL (ref 70–99)
Potassium: 3.7 mmol/L (ref 3.5–5.1)
Sodium: 134 mmol/L — ABNORMAL LOW (ref 135–145)
Total Bilirubin: 0.2 mg/dL — ABNORMAL LOW (ref 0.3–1.2)
Total Protein: 6.2 g/dL — ABNORMAL LOW (ref 6.5–8.1)

## 2023-03-01 LAB — WET PREP, GENITAL
Clue Cells Wet Prep HPF POC: NONE SEEN
Sperm: NONE SEEN
WBC, Wet Prep HPF POC: <10 (ref <10)
Yeast Wet Prep HPF POC: NONE SEEN

## 2023-03-01 LAB — URINALYSIS, ROUTINE W REFLEX MICROSCOPIC
Bilirubin Urine: NEGATIVE
Glucose, UA: NEGATIVE mg/dL
Ketones, ur: NEGATIVE mg/dL
Nitrite: NEGATIVE
Protein, ur: NEGATIVE mg/dL
Specific Gravity, Urine: 1.006 (ref 1.005–1.030)
pH: 7 (ref 5.0–8.0)

## 2023-03-01 LAB — HIV ANTIBODY (ROUTINE TESTING W REFLEX): HIV Screen 4th Generation wRfx: NONREACTIVE

## 2023-03-01 LAB — FERRITIN: Ferritin: 8 ng/mL — ABNORMAL LOW (ref 11–307)

## 2023-03-01 LAB — CHLAMYDIA/NGC RT PCR (ARMC ONLY)
Chlamydia Tr: NOT DETECTED
N gonorrhoeae: NOT DETECTED

## 2023-03-01 LAB — ABO/RH: ABO/RH(D): O POS

## 2023-03-01 LAB — GROUP B STREP BY PCR: Group B strep by PCR: NEGATIVE

## 2023-03-01 LAB — RUPTURE OF MEMBRANE (ROM)PLUS: Rom Plus: POSITIVE

## 2023-03-01 MED ORDER — METRONIDAZOLE 500 MG PO TABS
2000.0000 mg | ORAL_TABLET | Freq: Once | ORAL | Status: AC
  Administered 2023-03-01: 2000 mg via ORAL
  Filled 2023-03-01: qty 4

## 2023-03-01 MED ORDER — ONDANSETRON 4 MG PO TBDP
4.0000 mg | ORAL_TABLET | Freq: Three times a day (TID) | ORAL | Status: DC | PRN
  Administered 2023-03-01: 4 mg via ORAL
  Filled 2023-03-01: qty 1

## 2023-03-01 MED ORDER — LACTATED RINGERS IV SOLN: INTRAVENOUS | Status: DC

## 2023-03-01 MED ORDER — SODIUM CHLORIDE 0.9 % IV SOLN
2.0000 g | Freq: Four times a day (QID) | INTRAVENOUS | Status: DC
  Administered 2023-03-01 – 2023-03-02 (×4): 2 g via INTRAVENOUS
  Filled 2023-03-01 (×6): qty 2000

## 2023-03-01 MED ORDER — ZOLPIDEM TARTRATE 5 MG PO TABS: 5.0000 mg | ORAL_TABLET | Freq: Every evening | ORAL | Status: DC | PRN

## 2023-03-01 MED ORDER — ACETAMINOPHEN 325 MG PO TABS
650.0000 mg | ORAL_TABLET | ORAL | Status: DC | PRN
  Administered 2023-03-01: 650 mg via ORAL
  Filled 2023-03-01: qty 2

## 2023-03-01 MED ORDER — SODIUM CHLORIDE 0.9 % IV SOLN
500.0000 mg | INTRAVENOUS | Status: DC
  Administered 2023-03-01: 500 mg via INTRAVENOUS
  Filled 2023-03-01 (×2): qty 5

## 2023-03-01 MED ORDER — METRONIDAZOLE 500 MG PO TABS
500.0000 mg | ORAL_TABLET | Freq: Two times a day (BID) | ORAL | Status: DC
  Administered 2023-03-02: 500 mg via ORAL
  Filled 2023-03-01: qty 1

## 2023-03-01 MED ORDER — DOCUSATE SODIUM 100 MG PO CAPS
100.0000 mg | ORAL_CAPSULE | Freq: Every day | ORAL | Status: DC
  Administered 2023-03-01 – 2023-03-02 (×2): 100 mg via ORAL
  Filled 2023-03-01 (×2): qty 1

## 2023-03-01 MED ORDER — SODIUM CHLORIDE 0.9% FLUSH
3.0000 mL | Freq: Two times a day (BID) | INTRAVENOUS | Status: DC
  Administered 2023-03-01: 3 mL via INTRAVENOUS

## 2023-03-01 MED ORDER — CALCIUM CARBONATE ANTACID 500 MG PO CHEW: 2.0000 | CHEWABLE_TABLET | ORAL | Status: DC | PRN

## 2023-03-01 MED ORDER — PRENATAL MULTIVITAMIN CH
1.0000 | ORAL_TABLET | Freq: Every day | ORAL | Status: DC
  Administered 2023-03-01 – 2023-03-02 (×2): 1 via ORAL
  Filled 2023-03-01 (×2): qty 1

## 2023-03-01 NOTE — OB Triage Note (Signed)
Pt G10 with mono/di twins presenting to ED at 18.6 weeks c/o gush of fluid. Evaluated in ED and by [facility] CNM. Pt here x approx 4 hrs and then [facility] provider will reevaluate pt.

## 2023-03-01 NOTE — Consult Note (Deleted)
Rhonda Collins is a 22 y.o. female. She is at [redacted]w[redacted]d gestation. No LMP recorded (exact date). Patient is pregnant. Estimated Date of Delivery: 07/26/23  Prenatal care site: [facility]    Current pregnancy complicated by:  - Mono/Di twins - Trichomoniasis, diagnosed 02/08/23  Chief complaint: vaginal discharge/leaking fluid  She reports this evening she was getting into bed and felt a gush of liquid from her vagina. She denies feeling a pop. The liquid continued to come out and she noted it was clear. She came to the ER for evaluation for possible PPROM. She was most recently diagnosed with trichomoniasis on 02/08/23 and treated with Metronidazole 500mg  BID x 7 days.   S: Resting comfortably. no CTX, no VB.   Active fetal movement.  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 Authorizing Provider  diphenhydrAMINE HCl, Sleep, 25 MG TBDP Take 1 tablet (25 mg total) by mouth at bedtime as needed (nausea). 11/24/22 12/24/22  Corena Herter, MD  ondansetron (ZOFRAN-ODT) 4 MG disintegrating tablet Take 1 tablet (4 mg total) by mouth every 8 (eight) hours as needed for nausea or vomiting. 11/25/22   Menshew, Charlesetta Ivory, PA-C  prenatal vitamin w/FE, FA (NATACHEW) 29-1 MG CHEW chewable tablet Chew 1 tablet by mouth daily at 12 noon. 11/25/22   Menshew, Charlesetta Ivory, PA-C      Social History: She  reports that she has been smoking e-cigarettes. She has never used smokeless tobacco. She reports that she does not currently use alcohol after a past usage of about 5.0 standard drinks of alcohol per week. She reports that she does not currently use drugs after  having used the following drugs: Marijuana.  Family History: family history includes Hypertension in her maternal grandfather.  no history of gyn cancers  Review of Systems: A full review of systems was performed and negative except as noted in the HPI.     O:  BP 128/78   Pulse 87   Temp 98.7 F (37.1 C) (Oral)   Resp 18   Ht 4\' 11"  (1.499 m)   Wt 45.4 kg   LMP  (Exact Date)   SpO2 100%   BMI 20.20 kg/m  Results for orders placed or performed during the hospital encounter of 02/28/23 (from the past 48 hour(s))  Wet prep, genital   Collection Time: 02/28/23 11:45 PM  Result Value Ref Range   Yeast Wet Prep HPF POC NONE SEEN NONE SEEN   Trich, Wet Prep PRESENT (A) NONE SEEN   Clue Cells Wet Prep HPF POC NONE SEEN NONE SEEN   WBC, Wet Prep HPF POC <10 <10   Sperm NONE SEEN   Chlamydia/NGC rt PCR ([facility] only)   Collection Time: 02/28/23 11:45 PM  Result Value Ref Range   Specimen source GC/Chlam ENDOCERVICAL    Chlamydia Tr NOT DETECTED NOT DETECTED   N gonorrhoeae NOT DETECTED NOT DETECTED  Rupture of Membrane (ROM) Plus   Collection Time: 02/28/23 11:45 PM  Result Value Ref Range   Rom Plus POSITIVE   Urinalysis, Routine w reflex microscopic -Urine, Clean Catch   Collection  Time: 02/28/23 11:45 PM  Result Value Ref Range   Color, Urine STRAW (A) YELLOW   APPearance CLEAR (A) CLEAR   Specific Gravity, Urine 1.006 1.005 - 1.030   pH 7.0 5.0 - 8.0   Glucose, UA NEGATIVE NEGATIVE mg/dL   Hgb urine dipstick MODERATE (A) NEGATIVE   Bilirubin Urine NEGATIVE NEGATIVE   Ketones, ur NEGATIVE NEGATIVE mg/dL   Protein, ur NEGATIVE NEGATIVE mg/dL   Nitrite NEGATIVE NEGATIVE   Leukocytes,Ua TRACE (A) NEGATIVE   RBC / HPF 21-50 0 - 5 RBC/hpf   WBC, UA 6-10 0 - 5 WBC/hpf   Bacteria, UA RARE (A) NONE SEEN   Squamous Epithelial / HPF 0-5 0 - 5 /HPF     Constitutional: NAD, AAOx3  HE/ENT: extraocular movements grossly intact, moist mucous membranes CV: RRR PULM: nl respiratory  effort, CTABL     Abd: gravid, non-tender, non-distended, soft      Ext: Non-tender, Nonedematous   Psych: mood appropriate, speech normal Pelvic: SSE done  Pelvic exam: normal external genitalia, vulva, vagina, cervix, uterus and adnexa. Yellowish-green frothy discharge present in vagina, small flecks of blood present in vagina, cervix is friable with touch. Speculum exam shows closed cervix with no fluid coming from it. Negative valsalva, negative pooling.   Fetal  monitoring: during [place] Baby A FHR= 143bpm Baby B FHR= 157bpm  OB US:  Baby A vertex, visually appropriate fluid present, I measured 3cm pocket of fluid in the image. FHR 143bpm.  Baby B breech, visually appropriate fluid present, pocket of fluid 6.85cm measured by sonographer. FHR= 157bpm.  Negative Nitrizine.  Negative fern test via microscope.  OB US Report:  CLINICAL DATA:  Amniotic fluid check.   EXAM: LIMITED OBSTETRIC ULTRASOUND   FINDINGS: Number of Fetuses:  2   Separating Membrane: Not Visualized   TWIN 1   Heart Rate:  143 bpm   Movement: Yes   Presentation: Cephalic   Placental Location: Anterior   Previa: No   Amniotic Fluid (Subjective): Decreased (largest pocket measures less than 1.0 cm)   BPD:  4.7cm 20w 2d   TWIN 2   Heart Rate:  157 bpm   Movement: Yes   Presentation: Breech   Placental Location: Anterior   Previa: No   Amniotic Fluid (Subjective): Increased (largest pocket measures 6.9 cm)   BPD:  4.5cm 19w 4d   MATERNAL FINDINGS:   Cervix:  Appears closed.   Uterus/Adnexae: No abnormality visualized.   IMPRESSION: 1. Viable intrauterine twin pregnancy, as described above, at approximately 20 weeks and 2 days gestation for Twin A and 19 weeks and 4 days gestation for Twin B. 2. Decreased amniotic fluid associated with Twin A and increased amniotic fluid associated with Twin B.   This exam is performed on an emergent basis and does not comprehensively  evaluate fetal size, dating, or anatomy; follow-up complete OB US should be considered if further fetal assessment is warranted  A/P: 22 y.o. [redacted]w[redacted]d here for antenatal surveillance for trichomoniasis and possible PPROM  Principle Diagnosis:  trichomoniasis, vaginal discharge  Previable PPROM: indeterminate. ROM plus was positive, but blood was present and trichomoniasis was present. This could be a false positive, since blood is known to cause a ROM plus to be a false positive. The radiologist read her [place] as having decreased amniotic fluid around Baby A. She had a negative speculum exam with no fluid noted coming from the cervix, negative valsalva and pooling, negative nitrizine, and negative fern test.  Discussed  her assessment, labs, and [place] report with Dr. Karie Schwalbe. Schermerhorn and patient will be moved to L&D and be placed on strict bedrest and orally hydrate for 4-6 hours. After 4-6hs, will repeat speculum exam and repeat pooling/valsalva, as well as fern test. If the repeat testing is positive for previable PPROM, will discuss plan with patient and consider broad spectrum antibiotics.  Trichomoniasis: she received Metronidazole 2g PO x1 in the ER, recommended taking Metronidazole 500mg  BID x 7 days. Partner needs treatment and they both should wait 7 days after completing antibiotics to resume sexual intercourse.   Janyce Llanos, CNM 03/01/2023 3:34 AM
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
## 2023-03-01 NOTE — Progress Notes (Signed)
Pt made comfortable. Drinks and crackers provided. Has been in the ED since 2230 10/14. Very tired and will rest awaiting revaluation.

## 2023-03-01 NOTE — Progress Notes (Signed)
OBGYN Antepartum Note 03/01/2023 1:42 PM   Patient Name: Rhonda Collins MRN: [id number] DOB: Jul 09, 2000   Admission date: 02/28/2023 LOS: 0    HPI: Reports leaking of fluid starting yesterday. Reports never took the metronidazole for trich.    OB History: OB History  Gravida Para Term Preterm AB Living  1 0 0 0 0 0  SAB IAB Ectopic Multiple Live Births  0 0 0 0 0    # Outcome Date GA Lbr Len/2nd Weight Sex Type Anes PTL Lv  1 Current              PHYSICAL EXAM: Temp:  [98.1 F (36.7 C)-98.7 F (37.1 C)] 98.1 F (36.7 C) (10/15 0836) Pulse Rate:  [87-110] 95 (10/15 0836) Resp:  [15-20] 16 (10/15 0836) BP: (112-145)/(54-78) 113/54 (10/15 0836) SpO2:  [99 %-100 %] 99 % (10/15 0445) Weight:  [45.4 kg] 45.4 kg (10/14 2305)   GEN: Alert and oriented, NAD CV: RRR PULM: CTAB, no crackles, wheezing, or rhonchi, breathing unlabored ABD: soft, NTTP, gravid Pelvic: Asher Muir, RN and Margaretmary Eddy, CNM were present as chaperones) Normal external female genitalia without lesions;  Normal bartholins/skenes glands, normal urethral meatus;  Vaginal mucosa pink and moist with normal rugae, no discharge in the vault,  Cervix friable (small flecs of blood) but no lesions. Visually closed. Negative pooling and ferning.  EXT:No BLE edema, non-tender   Last Cervical Exam: visually closed   FHT: present x2    Recent Labs    03/01/23 1007  WBC 10.2  HGB 8.6*  HCT 25.9*  MCV 82.0  PLT 253   Recent Labs    03/01/23 1007  NA 134*  K 3.7  CL 105  CO2 23  CREATININE 0.65  BUN 8  CALCIUM 8.3*  ALT 15  AST 23        Wet prep 10/14: +Trich, negative clue cells and yeast  LIMITED OBSTETRIC ULTRASOUND- 02/28/23   FINDINGS: Number of Fetuses:  2   Separating Membrane: Not Visualized   TWIN 1 Heart Rate:  143 bpm Movement: Yes Presentation: Cephalic Placental Location: Anterior Previa: No Amniotic Fluid (Subjective): Decreased (largest pocket measures less than 1.0  cm) BPD:  4.7cm 20w 2d   TWIN 2   Heart Rate:  157 bpm Movement: Yes Presentation: Breech  Placental Location: Anterior Previa: No  Amniotic Fluid (Subjective): Increased (largest pocket measures 6.9cm)  BPD:  4.5cm 19w 4d   MATERNAL FINDINGS:  Cervix:  Appears closed.  Uterus/Adnexae: No abnormality visualized.   IMPRESSION: 1. Viable intrauterine twin pregnancy, as described above, at approximately 20 weeks and 2 days gestation for Twin A and 19 weeks and 4 days gestation for Twin B. 2. Decreased amniotic fluid associated with Twin A and increased amniotic fluid associated with Twin B.  ASSESSMENT/PLAN: Rhonda Collins is a 22 y.o. G1P0000 at [redacted]w[redacted]d dated by -/9 with mo/di TIUP who is admitted for leaking of fluid concerning for previable PPROM.   #Leaking fluid- concern for previable PPROM #Trichomoniasis  - Dx with trichomoniasis on 02/08/23 and prescribed metronidazole but never took it  - On 10/14 at [redacted]w[redacted]d presented to the ED with reported leaking of fluid. Pelvic exam with "yellow-green discharge, small flecks of blood in vagina, cervix friable to touch, closed cervix with no fluid.Marland Kitchen Pooling and nitrazine negative but ROM was positive (however in the setting of blood this could be a false positive). Wet prep + trich but negative yeast/ BV. GC/CT negative.  - Repeat  speculum exam 10/15 AM negative pooling and ferning, friable closed cervix.  - Will empirically treat for PPROM while we observe to determine if patient is in fact is rupture - Latency antibiotics for empiric treatment of PPROM: IV amp 2g q6h x 2d, IV azithromycin 500 mg qd x 2d > PO amoxicillin 250 mg q8h x5d and PO azithromycin x 5d - Plan to repeat speculum exam tomorrow - PO metronidazole 500 mg BID x 7 days to treat trichomoniasis - Will consult MFM tomorrow  #Oligo twin A - U/s 10/15: Twin A MVP <1 and B 6.9 - Could be due to PPROM  - Encourage PO hydration - Plan to re-spec tomorrow as  above  #Mo/di TIUP #FWB - 10/15 Vtx/breech - Plan for MFM anatomy ultrasound tomorrow  #Hx THC use - UDS ordered  #Elevated BP - 1 mild range BP noted on admission. All other BPs normal.  - CBC/CMP wnl other than anemia - Continue to monitor  #Anemia of pregnancy- clinically stable - Hb 8.6 - Iron studies in process - IV iron ordered   #ROB: - RH+ - RPR and HIV ordered - GBS unknown- ordered  - PNV Daily   #PPX: SCDs ordered   Dispo: Continue inpatient management due to possible previable PPROM   Electronically signed by:  Romana Juniper, MD, 03/01/2023, 1:42 PM

## 2023-03-01 NOTE — H&P (Signed)
Rhonda Collins is a 22 y.o. female. She is at [redacted]w[redacted]d gestation. No LMP recorded (exact date). Patient is pregnant. Estimated Date of Delivery: 07/26/23  [facility]atal [facility]: [facility]    Current pregnancy complicated by:  - Mono/Di twins - Trichomoniasis, diagnosed 02/08/23  Chief complaint: vaginal discharge/leaking fluid  She reports this evening she was getting into bed and felt a gush of liquid from her vagina. She denies feeling a pop. The liquid continued to come out and she noted it was clear. She came to the ER for evaluation for possible PPROM. She was most recently diagnosed with trichomoniasis on 02/08/23 and treated with Metronidazole 500mg  BID x 7 days.   S: Resting comfortably. no CTX, no VB.   Active fetal movement.  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 Authorizing Provider  diphenhydrAMINE HCl, Sleep, 25 MG TBDP Take 1 tablet (25 mg total) by mouth at bedtime as needed (nausea). 11/24/22 12/24/22  Corena Herter, MD  ondansetron (ZOFRAN-ODT) 4 MG disintegrating tablet Take 1 tablet (4 mg total) by mouth every 8 (eight) hours as needed for nausea or vomiting. 11/25/22   Menshew, Charlesetta Ivory, PA-C  prenatal vitamin w/FE, FA (NATACHEW) 29-1 MG CHEW chewable tablet Chew 1 tablet by mouth daily at 12 noon. 11/25/22   Menshew, Charlesetta Ivory, PA-C      Social History: She  reports that she has been smoking e-cigarettes. She has never used smokeless tobacco. She reports that she does not currently use alcohol after a past usage of about 5.0 standard drinks of alcohol per week. She reports that she does not currently use drugs after  having used the following drugs: Marijuana.  Family History: family history includes Hypertension in her maternal grandfather.  no history of gyn cancers  Review of Systems: A full review of systems was performed and negative except as noted in the HPI.     O:  BP 128/78   Pulse 87   Temp 98.7 F (37.1 C) (Oral)   Resp 18   Ht 4\' 11"  (1.499 m)   Wt 45.4 kg   LMP  (Exact Date)   SpO2 100%   BMI 20.20 kg/m  Results for orders placed or performed during the hospital encounter of 02/28/23 (from the past 48 hour(s))  Wet prep, genital   Collection Time: 02/28/23 11:45 PM  Result Value Ref Range   Yeast Wet Prep HPF POC NONE SEEN NONE SEEN   Trich, Wet Prep PRESENT (A) NONE SEEN   Clue Cells Wet Prep HPF POC NONE SEEN NONE SEEN   WBC, Wet Prep HPF POC <10 <10   Sperm NONE SEEN   Chlamydia/NGC rt PCR ([facility] only)   Collection Time: 02/28/23 11:45 PM  Result Value Ref Range   Specimen source GC/Chlam ENDOCERVICAL    Chlamydia Tr NOT DETECTED NOT DETECTED   N gonorrhoeae NOT DETECTED NOT DETECTED  Rupture of Membrane (ROM) Plus   Collection Time: 02/28/23 11:45 PM  Result Value Ref Range   Rom Plus POSITIVE   Urinalysis, Routine w reflex microscopic -Urine, Clean Catch   Collection  Time: 02/28/23 11:45 PM  Result Value Ref Range   Color, Urine STRAW (A) YELLOW   APPearance CLEAR (A) CLEAR   Specific Gravity, Urine 1.006 1.005 - 1.030   pH 7.0 5.0 - 8.0   Glucose, UA NEGATIVE NEGATIVE mg/dL   Hgb urine dipstick MODERATE (A) NEGATIVE   Bilirubin Urine NEGATIVE NEGATIVE   Ketones, ur NEGATIVE NEGATIVE mg/dL   Protein, ur NEGATIVE NEGATIVE mg/dL   Nitrite NEGATIVE NEGATIVE   Leukocytes,Ua TRACE (A) NEGATIVE   RBC / HPF 21-50 0 - 5 RBC/hpf   WBC, UA 6-10 0 - 5 WBC/hpf   Bacteria, UA RARE (A) NONE SEEN   Squamous Epithelial / HPF 0-5 0 - 5 /HPF     Constitutional: NAD, AAOx3  HE/ENT: extraocular movements grossly intact, moist mucous membranes CV: RRR PULM: nl respiratory  effort, CTABL     Abd: gravid, non-tender, non-distended, soft      Ext: Non-tender, Nonedematous   Psych: mood appropriate, speech normal Pelvic: SSE done  Pelvic exam: normal external genitalia, vulva, vagina, cervix, uterus and adnexa. Yellowish-green frothy discharge present in vagina, small flecks of blood present in vagina, cervix is friable with touch. Speculum exam shows closed cervix with no fluid coming from it. Negative valsalva, negative pooling.   Fetal  monitoring: during [place] Baby A FHR= 143bpm Baby B FHR= 157bpm  OB US:  Baby A vertex, visually appropriate fluid present, I measured 3cm pocket of fluid in the image. FHR 143bpm.  Baby B breech, visually appropriate fluid present, pocket of fluid 6.85cm measured by sonographer. FHR= 157bpm.  Negative Nitrizine.  Negative fern test via microscope.  OB US Report:  CLINICAL DATA:  Amniotic fluid check.   EXAM: LIMITED OBSTETRIC ULTRASOUND   FINDINGS: Number of Fetuses:  2   Separating Membrane: Not Visualized   TWIN 1   Heart Rate:  143 bpm   Movement: Yes   Presentation: Cephalic   Placental Location: Anterior   Previa: No   Amniotic Fluid (Subjective): Decreased (largest pocket measures less than 1.0 cm)   BPD:  4.7cm 20w 2d   TWIN 2   Heart Rate:  157 bpm   Movement: Yes   Presentation: Breech   Placental Location: Anterior   Previa: No   Amniotic Fluid (Subjective): Increased (largest pocket measures 6.9 cm)   BPD:  4.5cm 19w 4d   MATERNAL FINDINGS:   Cervix:  Appears closed.   Uterus/Adnexae: No abnormality visualized.   IMPRESSION: 1. Viable intrauterine twin pregnancy, as described above, at approximately 20 weeks and 2 days gestation for Twin A and 19 weeks and 4 days gestation for Twin B. 2. Decreased amniotic fluid associated with Twin A and increased amniotic fluid associated with Twin B.   This exam is performed on an emergent basis and does not comprehensively  evaluate fetal size, dating, or anatomy; follow-up complete OB US should be considered if further fetal assessment is warranted  A/P: 22 y.o. [redacted]w[redacted]d here for antenatal surveillance for trichomoniasis and possible PPROM  Principle Diagnosis:  trichomoniasis, vaginal discharge  Previable PPROM: indeterminate. ROM plus was positive, but blood was present and trichomoniasis was present. This could be a false positive, since blood is known to cause a ROM plus to be a false positive. The radiologist read her [place] as having decreased amniotic fluid around Baby A. She had a negative speculum exam with no fluid noted coming from the cervix, negative valsalva and pooling, negative nitrizine, and negative fern test.  Discussed  her assessment, labs, and [place] report with Dr. Karie Schwalbe. Schermerhorn and patient will be moved to L&D and be placed on strict bedrest and orally hydrate for 4-6 hours. After 4-6hs, will repeat speculum exam and repeat pooling/valsalva, as well as fern test. If the repeat testing is positive for previable PPROM, will discuss plan with patient and consider broad spectrum antibiotics.  Trichomoniasis: she received Metronidazole 2g PO x1 in the ER, recommended taking Metronidazole 500mg  BID x 7 days. Partner needs treatment and they both should wait 7 days after completing antibiotics to resume sexual intercourse.   Janyce Llanos, CNM 03/01/2023 3:39 AM

## 2023-03-01 NOTE — ED Notes (Signed)
Patient emptied her bladder prior to departing from the ED.

## 2023-03-02 ENCOUNTER — Observation Stay (HOSPITAL_BASED_OUTPATIENT_CLINIC_OR_DEPARTMENT_OTHER): Payer: Medicaid Other

## 2023-03-02 ENCOUNTER — Other Ambulatory Visit: Payer: Self-pay

## 2023-03-02 DIAGNOSIS — O30032 Twin pregnancy, monochorionic/diamniotic, second trimester: Secondary | ICD-10-CM | POA: Diagnosis not present

## 2023-03-02 DIAGNOSIS — O42912 Preterm premature rupture of membranes, unspecified as to length of time between rupture and onset of labor, second trimester: Secondary | ICD-10-CM | POA: Diagnosis not present

## 2023-03-02 DIAGNOSIS — Z3A19 19 weeks gestation of pregnancy: Secondary | ICD-10-CM

## 2023-03-02 DIAGNOSIS — O4102X1 Oligohydramnios, second trimester, fetus 1: Secondary | ICD-10-CM

## 2023-03-02 DIAGNOSIS — A5901 Trichomonal vulvovaginitis: Secondary | ICD-10-CM

## 2023-03-02 LAB — URINE CULTURE: Culture: NO GROWTH

## 2023-03-02 LAB — RPR: RPR Ser Ql: NONREACTIVE
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
## 2023-03-02 NOTE — Progress Notes (Signed)
Patient left facility AMA with mother. AMA form given to patient to sign. Patient verbalized understanding. Patient ambulated off the floor. CNM Ulyses Southward made aware.

## 2023-03-02 NOTE — Discharge Summary (Signed)
Patient ID: [name] MRN: [id number] DOB/AGE: 05-Jun-2000 22 y.o.  Admit date: 02/28/2023 Discharge date: 03/02/2023  Admission Diagnoses: 22yo G1P0 at 20w1w presented with LOF at [redacted]w[redacted]d with mono/di twins.  Discharge Diagnoses: Left AMA Per MFM it is highly likely that she had PPROM and the amniotic fluid and twin A is decreased  Mono/Di Twins Anemia  Factors complicating pregnancy: Trich test positive 02/08/23  Multi-fetal gestation - Mono/Di twins    Prenatal Procedures:  Labs IV Ampicillin and Azithromycin PO Metronidazole Pelvic speculum exam x2 for ferning and pooling Transition of care consult Ultrasound with MFM consult  Consults: Maternal Fetal Medicine   Significant Diagnostic Studies:  Results for orders placed or performed during the hospital encounter of 02/28/23 (from the past 168 hour(s))  Wet prep, genital   Collection Time: 02/28/23 11:45 PM  Result Value Ref Range   Yeast Wet Prep HPF POC NONE SEEN NONE SEEN   Trich, Wet Prep PRESENT (A) NONE SEEN   Clue Cells Wet Prep HPF POC NONE SEEN NONE SEEN   WBC, Wet Prep HPF POC <10 <10   Sperm NONE SEEN   Chlamydia/NGC rt PCR ([facility] only)   Collection Time: 02/28/23 11:45 PM  Result Value Ref Range   Specimen source GC/Chlam ENDOCERVICAL    Chlamydia Tr NOT DETECTED NOT DETECTED   N gonorrhoeae NOT DETECTED NOT DETECTED  Rupture of Membrane (ROM) Plus   Collection Time: 02/28/23 11:45 PM  Result Value Ref Range   Rom Plus POSITIVE   Urinalysis, Routine w reflex microscopic -Urine, Clean Catch   Collection Time: 02/28/23 11:45 PM  Result Value Ref Range   Color, Urine STRAW (A) YELLOW   APPearance CLEAR (A) CLEAR   Specific Gravity, Urine 1.006 1.005 - 1.030   pH 7.0 5.0 - 8.0   Glucose, UA NEGATIVE NEGATIVE mg/dL   Hgb urine dipstick MODERATE (A) NEGATIVE   Bilirubin Urine NEGATIVE NEGATIVE   Ketones, ur NEGATIVE NEGATIVE mg/dL   Protein, ur NEGATIVE NEGATIVE mg/dL   Nitrite NEGATIVE  NEGATIVE   Leukocytes,Ua TRACE (A) NEGATIVE   RBC / HPF 21-50 0 - 5 RBC/hpf   WBC, UA 6-10 0 - 5 WBC/hpf   Bacteria, UA RARE (A) NONE SEEN   Squamous Epithelial / HPF 0-5 0 - 5 /HPF  RPR   Collection Time: 03/01/23 10:07 AM  Result Value Ref Range   RPR Ser Ql NON REACTIVE NON REACTIVE  CBC with Differential/Platelet   Collection Time: 03/01/23 10:07 AM  Result Value Ref Range   WBC 10.2 4.0 - 10.5 K/uL   RBC 3.16 (L) 3.87 - 5.11 MIL/uL   Hemoglobin 8.6 (L) 12.0 - 15.0 g/dL   HCT 78.4 (L) 69.6 - 29.5 %   MCV 82.0 80.0 - 100.0 fL   MCH 27.2 26.0 - 34.0 pg   MCHC 33.2 30.0 - 36.0 g/dL   RDW 28.4 13.2 - 44.0 %   Platelets 253 150 - 400 K/uL   nRBC 0.0 0.0 - 0.2 %   Neutrophils Relative % 63 %   Neutro Abs 6.4 1.7 - 7.7 K/uL   Lymphocytes Relative 27 %   Lymphs Abs 2.8 0.7 - 4.0 K/uL   Monocytes Relative 7 %   Monocytes Absolute 0.7 0.1 - 1.0 K/uL   Eosinophils Relative 2 %   Eosinophils Absolute 0.2 0.0 - 0.5 K/uL   Basophils Relative 0 %   Basophils Absolute 0.0 0.0 - 0.1 K/uL   Immature Granulocytes 1 %  Abs Immature Granulocytes 0.08 (H) 0.00 - 0.07 K/uL  HIV Antibody (routine testing w rflx)   Collection Time: 03/01/23 10:07 AM  Result Value Ref Range   HIV Screen 4th Generation wRfx Non Reactive Non Reactive  Comprehensive metabolic panel   Collection Time: 03/01/23 10:07 AM  Result Value Ref Range   Sodium 134 (L) 135 - 145 mmol/L   Potassium 3.7 3.5 - 5.1 mmol/L   Chloride 105 98 - 111 mmol/L   CO2 23 22 - 32 mmol/L   Glucose, Bld 76 70 - 99 mg/dL   BUN 8 6 - 20 mg/dL   Creatinine, Ser 1.61 0.44 - 1.00 mg/dL   Calcium 8.3 (L) 8.9 - 10.3 mg/dL   Total Protein 6.2 (L) 6.5 - 8.1 g/dL   Albumin 2.7 (L) 3.5 - 5.0 g/dL   AST 23 15 - 41 U/L   ALT 15 0 - 44 U/L   Alkaline Phosphatase 90 38 - 126 U/L   Total Bilirubin 0.2 (L) 0.3 - 1.2 mg/dL   GFR, Estimated >09 >60 mL/min   Anion gap 6 5 - 15  Ferritin   Collection Time: 03/01/23 10:07 AM  Result Value Ref  Range   Ferritin 8 (L) 11 - 307 ng/mL  Iron and TIBC   Collection Time: 03/01/23 10:07 AM  Result Value Ref Range   Iron 53 28 - 170 ug/dL   TIBC 454 (H) 098 - 119 ug/dL   Saturation Ratios 10 (L) 10.4 - 31.8 %   UIBC 472 ug/dL  Type and screen [facility]   Collection Time: 03/01/23 10:07 AM  Result Value Ref Range   ABO/RH(D) O POS    Antibody Screen NEG    Sample Expiration      03/04/2023,2359 Performed at [facility], [address], [place], [place] [address]   Urine Culture   Collection Time: 03/01/23 10:34 AM   Specimen: Urine, Clean Catch  Result Value Ref Range   Specimen Description      URINE, CLEAN CATCH Performed at [facility], [address] [place], [place] [address]    Special Requests      NONE Performed at [facility], [address], [place], [place] [address]    Culture      NO GROWTH Performed at [facility], [address] [place], [place] [address]    Report Status 03/02/2023 FINAL   Group B strep by PCR   Collection Time: 03/01/23 10:34 AM   Specimen: Vaginal/Rectal; Genital  Result Value Ref Range   Group B strep by PCR PRESUMPTIVE NEGATIVE PRESUMPTIVE NEGATIVE  ABO/Rh   Collection Time: 03/01/23 10:57 AM  Result Value Ref Range   ABO/RH(D)      O POS Performed at [facility], [address], [place], [place] [address]     Hospital Course:  This is a 22 y.o. G1P0000 with IUP at [redacted]w[redacted]d seen for LOF.  She was observed a little over 24 hours, completed MFM consult and u/s, and she never had and  signs/symptoms of labor.  MFM deemed her stable for discharge to home with outpatient follow up.  Discharge Physical Exam:  BP (!) 143/71 (BP Location: Left Arm)   Pulse 77   Temp 98.7 F (37.1 C) (Oral)   Resp 20   Ht 4\' 11"  (1.499 m)   Wt 45.4 kg   LMP  (Exact Date)   SpO2 100%   BMI 20.20 kg/m  General: NAD CV: RRR Pulm: nl effort ABD: s/nd/nt, gravid DVT  Evaluation: LE non-ttp, no evidence of DVT on exam.    Discharge Condition: Stable  Disposition: Discharge disposition: 07-Left Against Medical Advice/Left Without Being Seen/Elopement        Allergies as of 03/02/2023   No Known Allergies      Medication List     ASK your doctor about these medications    aspirin EC 81 MG tablet Take 81 mg by mouth every 4 (four) hours as needed for moderate pain (pain score 4-6). Swallow whole.   diphenhydrAMINE HCl (Sleep) 25 MG Tbdp Take 1 tablet (25 mg total) by mouth at bedtime as needed (nausea).   prenatal vitamin w/FE, FA 29-1 MG Chew chewable tablet Chew 1 tablet by mouth daily at 12 noon.        Follow-up Information     [facility]. Call in 2 day(s).   Why: make an ER follow-up appointment this week Contact information: [address] [place] [address] [phone]                Signed:  Haroldine Laws, CNM 03/02/2023 7:00 PM

## 2023-03-02 NOTE — Consult Note (Signed)
Maternal-Fetal Medicine   Name: Rhonda Collins DOB: 2001/03/19 MRN: [id number] Referring Provider: Thomasene Mohair, MD   I had the pleasure of seeing Rhonda Collins today at [facility], [facility].  She is G1 P0 at 19w 2 gestation with twin pregnancy and was admitted yesterday with diagnosis of preterm premature rupture of membranes.  Monochorionic-diamniotic twin pregnancy was diagnosed at 9-week ultrasound performed at your office.  Her pregnancy is dated by Dr. Aubery Lapping.  This is a natural conception.  Patient complains of leakage of amniotic fluid for 2 days.  She had minimal vaginal bleeding.  No history of uterine contractions or fever.  Since admission, patient received IV ampicillin and oral azithromycin. Past medical history: No history of hypertension or diabetes or any chronic medical conditions.  She reports she does not have sickle cell trait. Past surgical history: Nil of note. Allergies: No known drug allergies. Social history: Denies tobacco or drug or alcohol use.  Her partner is African-American and he is in good health. Family history: No history of venous embolism in the family. GYN history: No history of abnormal Pap smear or cervical surgeries.  She gives history of irregular periods (patient was on oral contraceptive).  No history of breast disease. Prenatal course: On cell-free fetal DNA screening, the risks of fetal aneuploidies are not increased.  Labs Hemoglobin 8.6, hematocrit 25.9, WBC 10.2, platelets 253.  Electrolytes normal.  AST 23, ALT 15, creatinine 0.65.  TIBC 525, ferritin 8, iron 53.  Ultrasound Monochorionic-diamniotic twin pregnancy. Twin A: Lower fetus, maternal left, cephalic presentation, anterior placenta.  Overall, amniotic fluid is decreased but the maximum vertical pocket measures 3 cm.  Good fetal activity is present.  Fetal anatomical survey was not performed. Twin B: Upper fetus, maternal right, transverse lie and head to maternal left,  anterior placenta.  Amniotic fluid is normal and good fetal activity seen.  Fetal anatomical survey was not performed. No evidence of twin-to-twin transfusion syndrome.  I counseled the patient and her mother was present with her.  Our concerns include: Preterm premature rupture of membranes It is highly likely that she had PPROM and the amniotic fluid and twin A is decreased.  I discussed the diagnosis and complications.  -About 50% of women, regardless of gestational age, deliver within first week of PPROM.  Antibiotics improved the latency for the role of antibiotics before [redacted] weeks gestation is not clear.  -Intra amniotic infection can complicate in 15% to 35% of pregnancies.  Placental abruption has been reported up to 5% of cases.  -Maternal sepsis occurs in about 1% of cases.  -Most important complications are the consequences of prematurity if delivered.  Neonatal sepsis, intraventricular hemorrhage and necrotizing enterocolitis are more common.  Neurological complications depend on the gestation at delivery.  -Pulmonary hypoplasia occurs in 2% to 20% of cases.  Even low amniotic fluid if present can confer protection against development of pulmonary hypoplasia.  -Outcomes in twin pregnancy with PPROM is similar to singleton pregnancies with PPROM.  -If twin A is delivered, delayed delivery of twin B can be considered in some cases by clamping the cord close to the external os.  However, the likelihood of intra amniotic infection is higher.  -Patient continues of pregnancy, we recommend inpatient management from [redacted] weeks gestation till delivery.  Antenatal corticosteroids should be given at 23 weeks.  -Patient was counseled that she has an option of termination of pregnancy because of potential fetal and maternal risks.  She informed that she would  like to continue her pregnancy.  Monochorionic-diamniotic twin pregnancy I explained the chorionicity.  -Monochorionic twins have a  higher rate of complications including miscarriages, congenital malformations, twin to twin transfusion syndrome (TTTS) (15%), selective growth restriction, and fetal demise of one or both twins. -Twin pregnancies are associated with increased likelihood of gestational diabetes, gestational hypertension, or preeclampsia, malpresentations, cesarean delivery and postpartum hemorrhage.  -Preterm delivery is the most-common complication of twin pregnancies.   -If amniotic fluid does not become normal (resealing of membranes), we recommend ultrasound every 2 weeks to rule out twin-to-twin transfusion syndrome.  Anemia Patient most likely has iron deficiency anemia and needs iron supplements.  If she is unable to take oral iron, iron transfusion should be considered.  Discussed with Dr. Jean Rosenthal.  Recommendations -Patient has an appointment for fetal anatomical survey next week. -Weekly limited scan to evaluate amniotic fluid. -If PPROM is confirmed, we recommend inpatient management at [redacted] weeks gestation. -Because of extreme prematurity, patient should be admitted at [facility]. -Consider outpatient transfer of care. -Continue antibiotics. -Antenatal corticosteroids are [redacted] weeks gestation. -Patient was instructed on the signs and symptoms of intra amniotic infection.  If she has fever or increased vaginal bleeding, she should contact your office.   Thank you for consultation.  If you have any questions or concerns, please contact me the [facility].  Consultation including face-to-face counseling (more than 50% of time spent) is 50 minutes.

## 2023-03-04 ENCOUNTER — Other Ambulatory Visit: Payer: Self-pay | Admitting: *Deleted

## 2023-03-04 ENCOUNTER — Inpatient Hospital Stay: Payer: Medicaid Other

## 2023-03-04 ENCOUNTER — Inpatient Hospital Stay
Admission: EM | Admit: 2023-03-04 | Discharge: 2023-03-05 | DRG: 832 | Disposition: A | Discharge status: Other Acute Inpatient Hospital | Payer: Medicaid Other | Attending: Obstetrics | Admitting: Obstetrics

## 2023-03-04 DIAGNOSIS — O99332 Smoking (tobacco) complicating pregnancy, second trimester: Secondary | ICD-10-CM | POA: Diagnosis present

## 2023-03-04 DIAGNOSIS — Z3A19 19 weeks gestation of pregnancy: Secondary | ICD-10-CM | POA: Diagnosis not present

## 2023-03-04 DIAGNOSIS — A5901 Trichomonal vulvovaginitis: Secondary | ICD-10-CM | POA: Diagnosis present

## 2023-03-04 DIAGNOSIS — O30032 Twin pregnancy, monochorionic/diamniotic, second trimester: Secondary | ICD-10-CM

## 2023-03-04 DIAGNOSIS — Z8249 Family history of ischemic heart disease and other diseases of the circulatory system: Secondary | ICD-10-CM | POA: Diagnosis not present

## 2023-03-04 DIAGNOSIS — O42112 Preterm premature rupture of membranes, onset of labor more than 24 hours following rupture, second trimester: Principal | ICD-10-CM | POA: Diagnosis present

## 2023-03-04 DIAGNOSIS — O98312 Other infections with a predominantly sexual mode of transmission complicating pregnancy, second trimester: Secondary | ICD-10-CM | POA: Diagnosis present

## 2023-03-04 DIAGNOSIS — R636 Underweight: Secondary | ICD-10-CM | POA: Diagnosis present

## 2023-03-04 DIAGNOSIS — F17290 Nicotine dependence, other tobacco product, uncomplicated: Secondary | ICD-10-CM | POA: Diagnosis present

## 2023-03-04 DIAGNOSIS — O42012 Preterm premature rupture of membranes, onset of labor within 24 hours of rupture, second trimester: Principal | ICD-10-CM | POA: Diagnosis present

## 2023-03-04 DIAGNOSIS — O42919 Preterm premature rupture of membranes, unspecified as to length of time between rupture and onset of labor, unspecified trimester: Secondary | ICD-10-CM

## 2023-03-04 DIAGNOSIS — O26892 Other specified pregnancy related conditions, second trimester: Secondary | ICD-10-CM | POA: Diagnosis present

## 2023-03-04 DIAGNOSIS — O99012 Anemia complicating pregnancy, second trimester: Secondary | ICD-10-CM | POA: Diagnosis present

## 2023-03-04 LAB — URINALYSIS, COMPLETE (UACMP) WITH MICROSCOPIC
Bacteria, UA: NONE SEEN
Bilirubin Urine: NEGATIVE
Glucose, UA: NEGATIVE mg/dL
Hgb urine dipstick: NEGATIVE
Ketones, ur: NEGATIVE mg/dL
Nitrite: NEGATIVE
Protein, ur: NEGATIVE mg/dL
Specific Gravity, Urine: 1.003 — ABNORMAL LOW (ref 1.005–1.030)
pH: 7 (ref 5.0–8.0)

## 2023-03-04 LAB — URINE DRUG SCREEN, QUALITATIVE (ARMC ONLY)
Amphetamines, Ur Screen: NOT DETECTED
Barbiturates, Ur Screen: NOT DETECTED
Benzodiazepine, Ur Scrn: NOT DETECTED
Cannabinoid 50 Ng, Ur ~~LOC~~: NOT DETECTED
Cocaine Metabolite,Ur ~~LOC~~: NOT DETECTED
MDMA (Ecstasy)Ur Screen: NOT DETECTED
Methadone Scn, Ur: NOT DETECTED
Opiate, Ur Screen: NOT DETECTED
Phencyclidine (PCP) Ur S: NOT DETECTED
Tricyclic, Ur Screen: NOT DETECTED

## 2023-03-04 MED ORDER — SODIUM CHLORIDE 0.9 % IV SOLN
500.0000 mg | INTRAVENOUS | Status: DC
  Administered 2023-03-05: 500 mg via INTRAVENOUS
  Filled 2023-03-04: qty 5

## 2023-03-04 MED ORDER — PRENATAL MULTIVITAMIN CH
1.0000 | ORAL_TABLET | Freq: Every day | ORAL | Status: DC
  Administered 2023-03-05: 1 via ORAL
  Filled 2023-03-04: qty 1

## 2023-03-04 MED ORDER — LIDOCAINE HCL (PF) 1 % IJ SOLN: 30.0000 mL | INTRAMUSCULAR | Status: DC | PRN

## 2023-03-04 MED ORDER — OXYTOCIN-SODIUM CHLORIDE 30-0.9 UT/500ML-% IV SOLN: 2.5000 [IU]/h | INTRAVENOUS | Status: DC

## 2023-03-04 MED ORDER — LACTATED RINGERS IV SOLN: INTRAVENOUS | Status: DC

## 2023-03-04 MED ORDER — SODIUM CHLORIDE 0.9 % IV SOLN
2.0000 g | Freq: Four times a day (QID) | INTRAVENOUS | Status: DC
  Administered 2023-03-05 (×3): 2 g via INTRAVENOUS
  Filled 2023-03-04 (×3): qty 2000

## 2023-03-04 MED ORDER — OXYTOCIN BOLUS FROM INFUSION: 333.0000 mL | Freq: Once | INTRAVENOUS | Status: DC

## 2023-03-04 MED ORDER — ONDANSETRON HCL 4 MG/2ML IJ SOLN: 4.0000 mg | Freq: Four times a day (QID) | INTRAMUSCULAR | Status: DC | PRN

## 2023-03-04 MED ORDER — FERROUS SULFATE 325 (65 FE) MG PO TABS
325.0000 mg | ORAL_TABLET | Freq: Two times a day (BID) | ORAL | Status: DC
  Administered 2023-03-05: 325 mg via ORAL
  Filled 2023-03-04: qty 1

## 2023-03-04 MED ORDER — LACTATED RINGERS IV SOLN: 500.0000 mL | INTRAVENOUS | Status: DC | PRN

## 2023-03-04 MED ORDER — AMOXICILLIN 250 MG PO CAPS
250.0000 mg | ORAL_CAPSULE | Freq: Three times a day (TID) | ORAL | Status: DC
  Filled 2023-03-04: qty 1

## 2023-03-04 MED ORDER — SODIUM CHLORIDE 0.9% FLUSH: 3.0000 mL | Freq: Two times a day (BID) | INTRAVENOUS | Status: DC

## 2023-03-04 MED ORDER — SOD CITRATE-CITRIC ACID 500-334 MG/5ML PO SOLN: 30.0000 mL | ORAL | Status: DC | PRN

## 2023-03-04 MED ORDER — SODIUM CHLORIDE 0.9 % IV SOLN: 250.0000 mL | INTRAVENOUS | Status: DC | PRN

## 2023-03-04 MED ORDER — ACETAMINOPHEN 500 MG PO TABS: 1000.0000 mg | ORAL_TABLET | Freq: Four times a day (QID) | ORAL | Status: DC | PRN

## 2023-03-04 MED ORDER — SODIUM CHLORIDE 0.9% FLUSH: 3.0000 mL | INTRAVENOUS | Status: DC | PRN

## 2023-03-04 MED ORDER — AZITHROMYCIN 250 MG PO TABS
250.0000 mg | ORAL_TABLET | Freq: Every day | ORAL | Status: DC
  Filled 2023-03-04: qty 1

## 2023-03-04 MED ORDER — FENTANYL CITRATE (PF) 100 MCG/2ML IJ SOLN: 50.0000 ug | INTRAMUSCULAR | Status: DC | PRN

## 2023-03-04 NOTE — H&P (Signed)
OB History & Physical   History of Present Illness:   Chief Complaint: "something is hanging out of my vagina"  HPI:  JAMEAH ROUBIDEAUX is a 22 y.o. G1P0000 female at [redacted]w[redacted]d, 07/26/2023, by Ultrasound, unknown LMP. She presents to L&D after she felt like something was coming out of her vagina.  She was previously admitted this week for pPROM of baby A at 19 weeks on 02/28/2023. Zonnie was discharged AMA on 03/02/2023 after MFM consult.  She returned this evening after she noticed an umbilical cord hanging out of her vagina. She's had continued small amounts of leaking and irregular cramping. Her pregnancy is complicated by pPROM, mono/di twins, and trichomonas in pregnancy .  She denies Vaginal bleeding. Endorses fetal movement as active. Her mother is present at the bedside.   Reports active fetal movement  Contractions: irregular cramping  LOF/SROM: pPROM on 02/28/2023 Vaginal bleeding: denies   Factors complicating pregnancy:  Principal Problem:   Preterm premature rupture of membranes (PPROM) with onset of labor after 24 hours of rupture in third trimester, antepartum    Patient Active Problem List   Diagnosis Date Noted   Preterm premature rupture of membranes (PPROM) with onset of labor after 24 hours of rupture in third trimester, antepartum 03/04/2023   Premature rupture of membranes in second trimester 03/02/2023   Trichomonas vaginitis 03/02/2023   Amniotic fluid leaking 03/01/2023   Twin pregnancy 02/25/2023   Underweight BMI=16.0 07/15/2020   Short stature 07/15/2020   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    Prenatal Transfer Tool  Maternal Diabetes: No Genetic Screening: Normal Maternal Ultrasounds/Referrals: Other: pPROM of baby A Fetal Ultrasounds or other Referrals:  Referred to Materal Fetal Medicine  Maternal Substance Abuse:  No Significant Maternal Medications:  None Significant Maternal Lab Results: Group B Strep negative and Other: trichomonas positive  02/28/2023  Maternal Medical History:   Past Medical History:  Diagnosis Date   Breast abscess    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 Authorizing Provider  aspirin EC 81 MG tablet Take 81 mg by mouth every 4 (four) hours as needed for moderate pain (pain score 4-6). Swallow whole.    [provider]  diphenhydrAMINE HCl, Sleep, 25 MG TBDP Take 1 tablet (25 mg total) by mouth at bedtime as needed (nausea). 11/24/22 12/24/22  Corena Herter, MD  prenatal vitamin w/FE, FA (NATACHEW) 29-1 MG CHEW chewable tablet Chew 1 tablet by mouth daily at 12 noon. 11/25/22   Menshew, Charlesetta Ivory, PA-C     Prenatal care site:  [facility]  OB History  Gravida Para Term Preterm AB Living  1 0 0 0 0 0  SAB IAB Ectopic Multiple Live Births  0 0 0 0 0    # Outcome Date GA Lbr Len/2nd Weight Sex Type Anes PTL Lv  1 Current              Social History: She  reports that she has been smoking e-cigarettes. She has never used smokeless tobacco. She reports that she does not currently use alcohol after a past usage of about 5.0 standard drinks of alcohol per week. She reports that she does not currently use drugs after having used the following drugs: Marijuana.  Family History: family history  includes Hypertension in her maternal grandfather.   Review of Systems: A full review of systems was performed and negative except as noted in the HPI.     Physical Exam:  Vital Signs: BP 139/89 (BP Location: Left Arm)   Pulse (!) 110   Temp 99 F (37.2 C) (Oral)   Resp 18   LMP  (Exact Date)   General: no acute distress.  HEENT: normocephalic, atraumatic Lungs: normal respiratory effort Abdomen: soft, gravid, non-tender Pelvic:   External: Normal external female genitalia, umbilical cord seen  protruding from introitus   Cervix: Dilation: 1 / Effacement (%): 90 /     Extremities: non-tender, symmetric, no edema bilaterally.  DTRs: 2+/2+  Neurologic: Alert & oriented x 3.    No results found for this or any previous visit (from the past 24 hour(s)).  Pertinent Results:  Prenatal Labs: Blood type/Rh O POS Performed at [facility], [address] [place], [place] [address]    Antibody screen Negative    Rubella Immune    Varicella Immune  RPR NON REACTIVE (10/15 1007)   HBsAg Neg   Hep C NR   HIV Non Reactive (10/15 1007)   GC neg  Chlamydia neg  Genetic screening cfDNA negative   1 hour GTT N/A  3 hour GTT N/A  GBS PRESUMPTIVE NEGATIVE/-- (10/15 1034)    Baby A: FHT 144 bpm by doppler, distinguished from maternal pulse  Baby B: FHT 156 bpm by doppler, distinguished from maternal pulse   [place] MFM OB LIMITED  Result Date: 03/02/2023 ----------------------------------------------------------------------  OBSTETRICS REPORT                       (Signed Final 03/02/2023 11:53 am) ---------------------------------------------------------------------- Patient Info  ID #:       [id number]                          D.O.B.:  Mar 04, 2001 (22 yrs)  Name:       Everett Graff             Visit Date: 03/02/2023 10:29 am ---------------------------------------------------------------------- Performed By  Attending:        Noralee Space MD        Ref. Address:     [facility]  Performed By:     Eden Lathe BS      [facility]                    RD[facility] RVT                                 Fetal Care at                                                             [facility]  Referred By:      Conard Novak MD ---------------------------------------------------------------------- Orders  #  Description  Code        Ordered By  1  [place] MFM OB LIMITED                     [id number]    RAVI [facility]  ----------------------------------------------------------------------  #  Order #                     Accession #                Episode #  1  [id number]                   [id number]                 [id number] ---------------------------------------------------------------------- Indications  Twin pregnancy, mono/di, second trimester      O30.032  [redacted] weeks gestation of pregnancy                Z3A.19  Premature rupture of membranes - leaking       O42.90  fluid (Twin A)  Oligohydraminios, second trimester, fetus 1    O41.02X1 ---------------------------------------------------------------------- Fetal Evaluation (Fetus A)  Num Of Fetuses:         2  Fetal Heart Rate(bpm):  148  Cardiac Activity:       Observed  Fetal Lie:              Maternal left lower side  Presentation:           Cephalic  Placenta:               Anterior  P. Cord Insertion:      Not well visualized  Membrane Desc:      Dividing Membrane seen - Monochorionic  Amniotic Fluid  AFI FV:      Subjectively decreased                              Largest Pocket(cm)                              2.75 ---------------------------------------------------------------------- Biometry (Fetus A)  LV:        6.4  mm ---------------------------------------------------------------------- OB History  Gravidity:    1         Term:   0        Prem:   0        SAB:   0  TOP:          0       Ectopic:  0        Living: 0 ---------------------------------------------------------------------- Gestational Age (Fetus A)  LMP:           48w 4d        Date:  03/27/22                 EDD:   01/01/23  Best:          19w 2d     Det. ByMarcella Dubs         EDD:   07/25/23                                      (12/23/22) ---------------------------------------------------------------------- Anatomy (Fetus A)  Ventricles:  Appears normal         Stomach:                Appears normal, left                                                                        sided   Face:                  Profile appears        Kidneys:                Appear normal                         normal  Heart:                 Appears normal         Bladder:                Appears normal                         (4CH, axis, and                         situs) ---------------------------------------------------------------------- Fetal Evaluation (Fetus B)  Num Of Fetuses:         2  Fetal Heart Rate(bpm):  153  Cardiac Activity:       Observed  Fetal Lie:              Right Upper Fetus  Presentation:           Transverse, head to maternal left  Placenta:               Anterior  P. Cord Insertion:      Not well visualized  Membrane Desc:      Dividing Membrane seen - Monochorionic  Amniotic Fluid  AFI FV:      Within normal limits                              Largest Pocket(cm)                              7.41 ---------------------------------------------------------------------- Biometry (Fetus B)  LV:          5  mm ---------------------------------------------------------------------- Gestational Age (Fetus B)  LMP:           48w 4d        Date:  03/27/22                 EDD:   01/01/23  Best:          19w 2d     Det. ByMarcella Dubs         EDD:   07/25/23                                      (12/23/22) ---------------------------------------------------------------------- Anatomy (Fetus  B)  Ventricles:            Appears normal         Kidneys:                Appear normal  Face:                  Profile appears        Bladder:                Appears normal                         normal  Stomach:               Previously Seen ---------------------------------------------------------------------- Cervix Uterus Adnexa  Cervix  Length:            3.2  cm.  Normal appearance by translabial scan  Uterus  No abnormality visualized.  Right Ovary  Not visualized.  Left Ovary  Not visualized.  Cul De Sac  No free fluid seen.  Adnexa  No abnormality visualized  ---------------------------------------------------------------------- Impression  Monochorionic-diamniotic twin pregnancy.  Twin A: Lower fetus, maternal left, cephalic presentation,  anterior placenta.  Overall, amniotic fluid is decreased but the  maximum vertical pocket measures 3 cm.  Good fetal activity  is present.  Fetal anatomical survey was not performed.  Twin B: Upper fetus, maternal right, transverse lie and head  to maternal left, anterior placenta.  Amniotic fluid is normal  and good fetal activity seen.  Fetal anatomical survey was not  performed.  No evidence of twin-to-twin transfusion syndrome.  xxxxxxxxxxxxxxxxxxxxxxxxxxxxxxxxxxxxxxxxxxxxxxxxxxxxxxxxx  xxxxx  I had the pleasure of seeing Ms. Kamstra today at [facility].  She is G1 P0 at 19w 2 gestation with twin  pregnancy and was admitted yesterday with diagnosis of  preterm premature rupture of membranes.  Monochorionic-diamniotic twin pregnancy was diagnosed at 93-  week ultrasound performed at your office.  Her pregnancy is  dated by Dr. Aubery Lapping.  This is a natural conception.  Patient complains of leakage of amniotic fluid for 2 days.  She had minimal vaginal bleeding.  No history of uterine  contractions or fever.  Since admission, patient received IV  ampicillin and oral azithromycin.  Past medical history: No history of hypertension or diabetes  or any chronic medical conditions.  She reports she does not  have sickle cell trait.  Past surgical history: Nil of note.  Allergies: No known drug allergies.  Social history: Denies tobacco or drug or alcohol use.  Her  partner is African-American and he is in good health.  Family history: No history of venous embolism in the family.  GYN history: No history of abnormal Pap smear or cervical  surgeries.  She gives history of irregular periods (patient was  on oral contraceptive).  No history of breast disease.  Prenatal course: On cell-free fetal DNA screening, the risks of   fetal aneuploidies are not increased.  Labs  Hemoglobin 8.6, hematocrit 25.9, WBC 10.2, platelets 253.  Electrolytes normal.  AST 23, ALT 15, creatinine 0.65.  TIBC  525, ferritin 8, iron 53.  I counseled the patient and her mother was present with her.  Our concerns include:  Preterm premature rupture of membranes  It is highly likely that she had PPROM and the amniotic fluid  and twin A is decreased.  I discussed the diagnosis and  complications.  -About 50%  of women, regardless of gestational age, deliver  within first week of PPROM.  Antibiotics improved the latency  for the role of antibiotics before [redacted] weeks gestation is not  clear.  -Intra amniotic infection can complicate in 15% to 35% of  pregnancies.  Placental abruption has been reported up to  5% of cases.  -Maternal sepsis occurs in about 1% of cases  -Most important complications are the consequences of  prematurity if delivered.  Neonatal sepsis, intraventricular  hemorrhage and necrotizing enterocolitis are more common.  Neurological complications depend on the gestation at  delivery.  -Pulmonary hypoplasia occurs in 2% to 20% of cases.  Even  low amniotic fluid if present can confer protection against  development of pulmonary hypoplasia.  -Outcomes in twin pregnancy with PPROM is similar to  singleton pregnancies with PPROM.  -If twin A is delivered, delayed delivery of twin B can be  considered in some cases by clamping the cord close to the  external os.  However, the likelihood of intra amniotic  infection is higher.  -Patient continues of pregnancy, we recommend inpatient  management from [redacted] weeks gestation till delivery.  Antenatal  corticosteroids should be given at 23 weeks.  -Patient was counseled that she has an option of termination  of pregnancy because of potential fetal and maternal risks.  She informed that she would like to continue her pregnancy.  Monochorionic-diamniotic twin pregnancy  I explained the chorionicity.  -Monochorionic  twins have a higher rate of complications  including miscarriages, congenital malformations, twin to twin  transfusion syndrome (TTTS) (15%), selective growth  restriction, and fetal demise of one or both twins.  -Twin pregnancies are associated with increased likelihood of  gestational diabetes, gestational hypertension, or  preeclampsia, malpresentations, cesarean delivery and  postpartum hemorrhage.  -Preterm delivery is the most-common complication of twin  pregnancies.  -If amniotic fluid does not become normal (resealing of  membranes), we recommend ultrasound every 2 weeks to  rule out twin-to-twin transfusion syndrome.  Anemia  Patient most likely has iron deficiency anemia and needs iron  supplements.  If she is unable to take oral iron, iron  transfusion should be considered.  Discussed with Dr. Jean Rosenthal. ---------------------------------------------------------------------- Recommendations  -Patient has an appointment for fetal anatomical survey next  week.  -Weekly limited scan to evaluate amniotic fluid.  -If PPROM is confirmed, we recommend inpatient  management at [redacted] weeks gestation.  -Because of extreme prematurity, patient should be admitted  at [facility], [facility].  -Consider outpatient transfer of care.  -Continue antibiotics.  -Antenatal corticosteroids are [redacted] weeks gestation.  -Patient was instructed on the signs and symptoms of intra  amniotic infection.  If she has fever or increased vaginal  bleeding, she should contact your office. ----------------------------------------------------------------------                  Noralee Space, MD Electronically Signed Final Report   03/02/2023 11:53 am ----------------------------------------------------------------------   US OB Limited  Result Date: 03/01/2023 CLINICAL DATA:  Amniotic fluid check. EXAM: LIMITED OBSTETRIC ULTRASOUND FINDINGS: Number of Fetuses:  2 Separating Membrane: Not Visualized TWIN 1 Heart Rate:  143 bpm Movement:  Yes Presentation: Cephalic Placental Location: Anterior Previa: No Amniotic Fluid (Subjective): Decreased (largest pocket measures less than 1.0 cm) BPD:  4.7cm 20w 2d TWIN 2 Heart Rate:  157 bpm Movement: Yes Presentation: Breech Placental Location: Anterior Previa: No Amniotic Fluid (Subjective): Increased (largest pocket measures 6.9 cm) BPD:  4.5cm 19w 4d MATERNAL FINDINGS: Cervix:  Appears closed. Uterus/Adnexae: No abnormality visualized. IMPRESSION: 1. Viable intrauterine twin pregnancy, as described above, at approximately 20 weeks and 2 days gestation for Twin A and 19 weeks and 4 days gestation for Twin B. 2. Decreased amniotic fluid associated with Twin A and increased amniotic fluid associated with Twin B. This exam is performed on an emergent basis and does not comprehensively evaluate fetal size, dating, or anatomy; follow-up complete OB US should be considered if further fetal assessment is warranted Electronically Signed   By: Aram Candela M.D.   On: 03/01/2023 03:19   US OB Limited  Result Date: 03/01/2023 CLINICAL DATA:  Amniotic fluid check. EXAM: LIMITED OBSTETRIC ULTRASOUND FINDINGS: Number of Fetuses:  2 Separating Membrane: Not Visualized TWIN 1 Heart Rate:  143 bpm Movement: Yes Presentation: Cephalic Placental Location: Anterior Previa: No Amniotic Fluid (Subjective): Decreased (largest pocket measures less than 1.0 cm) BPD:  4.7cm 20w 2d TWIN 2 Heart Rate:  157 bpm Movement: Yes Presentation: Breech Placental Location: Anterior Previa: No Amniotic Fluid (Subjective): Increased (largest pocket measures 6.9 cm) BPD:  4.5cm 19w 4d MATERNAL FINDINGS: Cervix:  Appears closed. Uterus/Adnexae: No abnormality visualized. IMPRESSION: 1. Viable intrauterine twin pregnancy, as described above, at approximately 20 weeks and 2 days gestation for Twin A and 19 weeks and 4 days gestation for Twin B. 2. Decreased amniotic fluid associated with Twin A and increased amniotic fluid associated with  Twin B. This exam is performed on an emergent basis and does not comprehensively evaluate fetal size, dating, or anatomy; follow-up complete OB US should be considered if further fetal assessment is warranted Electronically Signed   By: Aram Candela M.D.   On: 03/01/2023 03:19    Assessment:  ANALIS YELINEK is a 22 y.o. G1P0000 female at [redacted]w[redacted]d with pPROM and umbilical cord prolapse.   Plan:  1. Admit to Labor & Delivery - Admission status: Inpatient - Dr Beverly Gust MD notified of admission and plan of care  - Reason for admission: pPROM and umbilical cord prolapse  - consents reviewed and obtained  2. pPROM - Discussed care with Dr. Judeth Cornfield and Dr. Feliberto Gottron  - Fetal cardiac activity confirmed for both baby A and baby B   - Discussed with Latresha and her mother the high risk of mortality of baby A given previable pPROM and prolapsed umbilical cord. We also discussed the risk of infection and maternal sepsis for Zoriah herself.  - Dr. Judeth Cornfield previously discussed that in some cases, baby B's delivery can be delayed if baby A delivers.  However, there is a higher risk of intra amniotic infection  -Reviewed options for expectant management, induction of labor, and transfer for D&E.  Risks and benefits were discussed. Each method was explained.   -I also reviewed that if she developed s/s of infection that we would recommend proceeding with an induction regardless of gestational age.  -At this time, Marcena desires to proceed with expectant management.  She is aware that she can change her mind at any time.  -will start latency antibiotics  -trend CBC to monitor for infection   3. High risk OB: - Prenatal labs reviewed, as above - Rh positive - CBC, T&S, RPR on admit - Clear liquid diet , saline lock  4. Anemia in pregnancy  - Previously received iron transfusion on 03/01/2023 - continue prenatal vitamin and iron supplement   5. Trichomonas infection in pregnancy   - Tested positive for trichomonas 02/08/2023 - did not take antibiotics  -  02/28/2023 wet prep again positive for trichomonas - treated with flagyl in hospital   6. History of THC use in pregnancy  -UDS ordered   Gustavo Lah, CNM 03/04/23 11:14 PM  Margaretmary Eddy, CNM Certified Nurse Midwife [facility]

## 2023-03-05 ENCOUNTER — Encounter: Payer: Self-pay | Admitting: Obstetrics and Gynecology

## 2023-03-05 ENCOUNTER — Other Ambulatory Visit: Payer: Self-pay

## 2023-03-05 LAB — CBC
HCT: 22.5 % — ABNORMAL LOW (ref 36.0–46.0)
Hemoglobin: 7.6 g/dL — ABNORMAL LOW (ref 12.0–15.0)
MCH: 28 pg (ref 26.0–34.0)
MCHC: 33.8 g/dL (ref 30.0–36.0)
MCV: 83 fL (ref 80.0–100.0)
Platelets: 265 10*3/uL (ref 150–400)
RBC: 2.71 MIL/uL — ABNORMAL LOW (ref 3.87–5.11)
RDW: 13.1 % (ref 11.5–15.5)
WBC: 13.8 10*3/uL — ABNORMAL HIGH (ref 4.0–10.5)
nRBC: 0 % (ref 0.0–0.2)

## 2023-03-05 LAB — PREPARE RBC (CROSSMATCH)

## 2023-03-05 LAB — RPR: RPR Ser Ql: NONREACTIVE

## 2023-03-05 MED ORDER — METRONIDAZOLE 500 MG/100ML IV SOLN
500.0000 mg | Freq: Two times a day (BID) | INTRAVENOUS | Status: DC
  Administered 2023-03-05: 500 mg via INTRAVENOUS
  Filled 2023-03-05: qty 100

## 2023-03-05 MED ORDER — SODIUM CHLORIDE 0.9% IV SOLUTION: Freq: Once | INTRAVENOUS | Status: AC

## 2023-03-05 NOTE — Progress Notes (Signed)
Patient ID: Rhonda Collins, female   DOB: 01/03/2001, 22 y.o.   MRN: [id number] Involved in her care since last week . CNM MAckie with update last pm  with PPROM  Mono/Di twins  and prolapse cord .  MFM last pm didn't think she needed to me transferred. FHT x2 this am . On Ampicillin , azithromycin and flagyl for possible inadequate tx of trichomonas .! Unit of PRBC being administered for severe anemia . I have explained the likely scenario of infection and loss of both babies . Currently not clinically infected. She wishes for expectant management . If she becomes infected active management towards delivery is indicated for her health  ie avoid sepsis. . Patient does understand the babies are previable and can not survive at this stage of life .  After this discussed she wishes to be transferred to a tertiary facility . [facility] is her choice . CNM Rubye Oaks will arrange .

## 2023-03-05 NOTE — Discharge Summary (Signed)
Patient ID: KHAMAYA FIRST MRN: [id number] DOB/AGE: 04-Dec-2000 22 y.o.  Admit date: 03/04/2023 Discharge date: 03/05/2023  Admission Diagnoses: PPROM Mono/Di twins and prolapsed cord  Discharge Diagnoses: PPROM Mono/Di twins and prolapsed cord  Factors complicating pregnancy: - PPROM -Mono/Di Twins -Trichomonas vaginitis -underweight BMI 16.0 -Short stature -Nicotine Vapor  Prenatal Procedures: ultrasound  Consults:Maternal Fetal Medicine   Significant Diagnostic Studies:  Results for orders placed or performed during the hospital encounter of 03/04/23 (from the past 168 hour(s))  Urine Drug Screen, Qualitative ([facility] only)   Collection Time: 03/04/23 11:27 PM  Result Value Ref Range   Tricyclic, Ur Screen NONE DETECTED NONE DETECTED   Amphetamines, Ur Screen NONE DETECTED NONE DETECTED   MDMA (Ecstasy)Ur Screen NONE DETECTED NONE DETECTED   Cocaine Metabolite,Ur [place] NONE DETECTED NONE DETECTED   Opiate, Ur Screen NONE DETECTED NONE DETECTED   Phencyclidine (PCP) Ur S NONE DETECTED NONE DETECTED   Cannabinoid 50 Ng, Ur [place] NONE DETECTED NONE DETECTED   Barbiturates, Ur Screen NONE DETECTED NONE DETECTED   Benzodiazepine, Ur Scrn NONE DETECTED NONE DETECTED   Methadone Scn, Ur NONE DETECTED NONE DETECTED  Urinalysis, Complete w Microscopic -Urine, Clean Catch   Collection Time: 03/04/23 11:27 PM  Result Value Ref Range   Color, Urine STRAW (A) YELLOW   APPearance CLEAR (A) CLEAR   Specific Gravity, Urine 1.003 (L) 1.005 - 1.030   pH 7.0 5.0 - 8.0   Glucose, UA NEGATIVE NEGATIVE mg/dL   Hgb urine dipstick NEGATIVE NEGATIVE   Bilirubin Urine NEGATIVE NEGATIVE   Ketones, ur NEGATIVE NEGATIVE mg/dL   Protein, ur NEGATIVE NEGATIVE mg/dL   Nitrite NEGATIVE NEGATIVE   Leukocytes,Ua SMALL (A) NEGATIVE   RBC / HPF 0-5 0 - 5 RBC/hpf   WBC, UA 6-10 0 - 5 WBC/hpf   Bacteria, UA NONE SEEN NONE SEEN   Squamous Epithelial / HPF 0-5 0 - 5 /HPF  Type and screen [facility]   Collection Time: 03/05/23 12:17 AM  Result Value Ref Range   ABO/RH(D) O POS    Antibody Screen NEG    Sample Expiration 03/08/2023,2359    Unit Number [id number]    Blood Component Type RED CELLS,LR    Unit division 00    Status of Unit ISSUED    Transfusion Status OK TO TRANSFUSE    Crossmatch Result      Compatible Performed at [facility], [address] [place], [place] [address]   BPAM [facility]   Collection Time: 03/05/23 12:17 AM  Result Value Ref Range   ISSUE DATE / TIME [id number]    Blood Product Unit Number [id number]    PRODUCT CODE E0382V00    Unit Type and Rh 5100    Blood Product Expiration Date [id number]   CBC   Collection Time: 03/05/23 12:20 AM  Result Value Ref Range   WBC 13.8 (H) 4.0 - 10.5 K/uL   RBC 2.71 (L) 3.87 - 5.11 MIL/uL   Hemoglobin 7.6 (L) 12.0 - 15.0 g/dL   HCT 88.4 (L) 16.6 - 06.3 %   MCV 83.0 80.0 - 100.0 fL   MCH 28.0 26.0 - 34.0 pg   MCHC 33.8 30.0 - 36.0 g/dL   RDW 01.6 01.0 - 93.2 %   Platelets 265 150 - 400 K/uL   nRBC 0.0 0.0 - 0.2 %  RPR   Collection Time: 03/05/23 12:20 AM  Result Value Ref Range   RPR Ser Ql NON REACTIVE NON REACTIVE  Prepare RBC (crossmatch)   Collection Time: 03/05/23  7:48 AM  Result Value Ref Range   Order Confirmation      ORDER PROCESSED BY BLOOD BANK Performed at [facility], [address] [place], [place] [address]   Results for orders placed or performed during the hospital encounter of 02/28/23 (from the past 168 hour(s))  Wet prep, genital   Collection Time: 02/28/23 11:45 PM  Result Value Ref Range   Yeast Wet Prep HPF POC NONE SEEN NONE SEEN   Trich, Wet Prep PRESENT (A) NONE SEEN   Clue Cells Wet Prep HPF POC NONE SEEN NONE SEEN   WBC, Wet Prep HPF POC <10 <10   Sperm NONE SEEN   Chlamydia/NGC rt PCR ([facility] only)   Collection Time: 02/28/23 11:45 PM  Result Value Ref Range   Specimen source GC/Chlam ENDOCERVICAL    Chlamydia Tr NOT  DETECTED NOT DETECTED   N gonorrhoeae NOT DETECTED NOT DETECTED  Rupture of Membrane (ROM) Plus   Collection Time: 02/28/23 11:45 PM  Result Value Ref Range   Rom Plus POSITIVE   Urinalysis, Routine w reflex microscopic -Urine, Clean Catch   Collection Time: 02/28/23 11:45 PM  Result Value Ref Range   Color, Urine STRAW (A) YELLOW   APPearance CLEAR (A) CLEAR   Specific Gravity, Urine 1.006 1.005 - 1.030   pH 7.0 5.0 - 8.0   Glucose, UA NEGATIVE NEGATIVE mg/dL   Hgb urine dipstick MODERATE (A) NEGATIVE   Bilirubin Urine NEGATIVE NEGATIVE   Ketones, ur NEGATIVE NEGATIVE mg/dL   Protein, ur NEGATIVE NEGATIVE mg/dL   Nitrite NEGATIVE NEGATIVE   Leukocytes,Ua TRACE (A) NEGATIVE   RBC / HPF 21-50 0 - 5 RBC/hpf   WBC, UA 6-10 0 - 5 WBC/hpf   Bacteria, UA RARE (A) NONE SEEN   Squamous Epithelial / HPF 0-5 0 - 5 /HPF  RPR   Collection Time: 03/01/23 10:07 AM  Result Value Ref Range   RPR Ser Ql NON REACTIVE NON REACTIVE  CBC with Differential/Platelet   Collection Time: 03/01/23 10:07 AM  Result Value Ref Range   WBC 10.2 4.0 - 10.5 K/uL   RBC 3.16 (L) 3.87 - 5.11 MIL/uL   Hemoglobin 8.6 (L) 12.0 - 15.0 g/dL   HCT 60.4 (L) 54.0 - 98.1 %   MCV 82.0 80.0 - 100.0 fL   MCH 27.2 26.0 - 34.0 pg   MCHC 33.2 30.0 - 36.0 g/dL   RDW 19.1 47.8 - 29.5 %   Platelets 253 150 - 400 K/uL   nRBC 0.0 0.0 - 0.2 %   Neutrophils Relative % 63 %   Neutro Abs 6.4 1.7 - 7.7 K/uL   Lymphocytes Relative 27 %   Lymphs Abs 2.8 0.7 - 4.0 K/uL   Monocytes Relative 7 %   Monocytes Absolute 0.7 0.1 - 1.0 K/uL   Eosinophils Relative 2 %   Eosinophils Absolute 0.2 0.0 - 0.5 K/uL   Basophils Relative 0 %   Basophils Absolute 0.0 0.0 - 0.1 K/uL   Immature Granulocytes 1 %   Abs Immature Granulocytes 0.08 (H) 0.00 - 0.07 K/uL  HIV Antibody (routine testing w rflx)   Collection Time: 03/01/23 10:07 AM  Result Value Ref Range   HIV Screen 4th Generation wRfx Non Reactive Non Reactive  Comprehensive  metabolic panel   Collection Time: 03/01/23 10:07 AM  Result Value Ref Range   Sodium 134 (L) 135 - 145 mmol/L   Potassium 3.7 3.5 -  5.1 mmol/L   Chloride 105 98 - 111 mmol/L   CO2 23 22 - 32 mmol/L   Glucose, Bld 76 70 - 99 mg/dL   BUN 8 6 - 20 mg/dL   Creatinine, Ser 1.47 0.44 - 1.00 mg/dL   Calcium 8.3 (L) 8.9 - 10.3 mg/dL   Total Protein 6.2 (L) 6.5 - 8.1 g/dL   Albumin 2.7 (L) 3.5 - 5.0 g/dL   AST 23 15 - 41 U/L   ALT 15 0 - 44 U/L   Alkaline Phosphatase 90 38 - 126 U/L   Total Bilirubin 0.2 (L) 0.3 - 1.2 mg/dL   GFR, Estimated >82 >95 mL/min   Anion gap 6 5 - 15  Ferritin   Collection Time: 03/01/23 10:07 AM  Result Value Ref Range   Ferritin 8 (L) 11 - 307 ng/mL  Iron and TIBC   Collection Time: 03/01/23 10:07 AM  Result Value Ref Range   Iron 53 28 - 170 ug/dL   TIBC 621 (H) 308 - 657 ug/dL   Saturation Ratios 10 (L) 10.4 - 31.8 %   UIBC 472 ug/dL  Type and screen [facility]   Collection Time: 03/01/23 10:07 AM  Result Value Ref Range   ABO/RH(D) O POS    Antibody Screen NEG    Sample Expiration      03/04/2023,2359 Performed at [facility], [address] [place], [place] [address]   Urine Culture   Collection Time: 03/01/23 10:34 AM   Specimen: Urine, Clean Catch  Result Value Ref Range   Specimen Description      URINE, CLEAN CATCH Performed at [facility], [address], [place], [place] [address]    Special Requests      NONE Performed at [facility], [address], [place], [place] [address]    Culture      NO GROWTH Performed at [facility], [address] [place], [place] [address]    Report Status 03/02/2023 FINAL   Group B strep by PCR   Collection Time: 03/01/23 10:34 AM   Specimen: Vaginal/Rectal; Genital  Result Value Ref Range   Group B strep by PCR PRESUMPTIVE NEGATIVE PRESUMPTIVE NEGATIVE  ABO/Rh   Collection Time: 03/01/23 10:57 AM  Result Value Ref Range    ABO/RH(D)      O POS Performed at [facility], [address] [place], [place] [address]     Treatments: IV hydration and antibiotics: azithromycin, metronidazole, and Ampicillin  Hospital Course:  This is a 22 y.o. G1P0000 with IUP at [redacted]w[redacted]d admitted for PPROM x 4 days, noted to have a cervical exam of 1/90. leaking of fluid noted and vaginal spotting after recent cervical exam.  She is currently on latent antibiotics for PPROM, and receiving IV Flagyl for Trichomonas. She has received 1 unit of pRBC's for a hgb of 7.6.  Discharge Physical Exam:  BP (!) 115/51   Pulse 93   Temp 98.2 F (36.8 C) (Oral)   Resp 16   Ht 4\' 11"  (1.499 m)   Wt 40.8 kg   LMP  (Exact Date)   SpO2 99%   BMI 18.18 kg/m   General: NAD CV: RRR Pulm: CTABL, nl effort ABD: s/nd/nt, gravid DVT Evaluation: LE non-ttp, no evidence of DVT on exam.  FHR baby A 147 FHR baby B 152  TOCO: quiet SVE:  Dilation: 1 Effacement (%): 90 Exam by:: Wendall Papa   Discharge Condition: Stable -  Transferred per patient request to [facility] in [place] for continued management   Disposition: Discharge disposition: 02-Transferred to [facility]        Allergies as of 03/05/2023   No Known Allergies      Medication List     STOP taking these medications    diphenhydrAMINE HCl (Sleep) 25 MG Tbdp       TAKE these medications    aspirin EC 81 MG tablet Take 81 mg by mouth every 4 (four) hours as needed for moderate pain (pain score 4-6). Swallow whole.   prenatal vitamin w/FE, FA 29-1 MG Chew chewable tablet Chew 1 tablet by mouth daily at 12 noon.         Signed:  Chari Manning CNM 03/05/2023 2:26 PM

## 2023-03-05 NOTE — Progress Notes (Signed)
Pt discharged to [facility]) transported by [employer]. Pt stable at the time of discharge from the facility.

## 2023-03-05 NOTE — Progress Notes (Signed)
Antepartum Note    Subjective:  Not feeling cramping or contractions   Objective:   Vitals:   03/05/23 0057 03/05/23 0220 03/05/23 0643 03/05/23 0749  BP:  (!) 101/46 (!) 100/50 (!) 104/54  Pulse:  86 88 91  Resp:  16 16 15   Temp:  98.2 F (36.8 C) 98 F (36.7 C) 98.3 F (36.8 C)  TempSrc:  Oral Oral Oral  Weight: 40.8 kg     Height: 4\' 11"  (1.499 m)       Current Vital Signs 24h Vital Sign Ranges  T 98.3 F (36.8 C) Temp  Avg: 98.4 F (36.9 C)  Min: 98 F (36.7 C)  Max: 99 F (37.2 C)  BP (!) 104/54 BP  Min: 100/50  Max: 139/89  HR 91 Pulse  Avg: 93.8  Min: 86  Max: 110  RR 15 Resp  Avg: 16.3  Min: 15  Max: 18  SaO2     No data recorded      Gen: alert, cooperative, no distress Abdomen: gravid, non-tender   SVE: Dilation: 1 Effacement (%): 90 Exam by:: A. Nhi Butrum CNM  Medications SCHEDULED MEDICATIONS   sodium chloride   Intravenous Once   [START ON 03/07/2023] amoxicillin  250 mg Oral Q8H   [START ON 03/07/2023] azithromycin  250 mg Oral Daily   ferrous sulfate  325 mg Oral BID WC   oxytocin 40 units in LR 1000 mL  333 mL Intravenous Once   prenatal multivitamin  1 tablet Oral Q1200   sodium chloride flush  3 mL Intravenous Q12H    MEDICATION INFUSIONS   sodium chloride     ampicillin (OMNIPEN) IV Stopped (03/05/23 0220)   azithromycin Stopped (03/05/23 0150)   lactated ringers     lactated ringers 125 mL/hr at 03/05/23 0042   oxytocin      PRN MEDICATIONS  sodium chloride, acetaminophen, fentaNYL (SUBLIMAZE) injection, lactated ringers, lidocaine (PF), ondansetron, sodium chloride flush, sodium citrate-citric acid   Assessment & Plan:  22 y.o. G1P0000 at [redacted]w[redacted]d admitted for pPROM at 19 weeks   Severe anemia  -Hgb 7.6. Previously 8.6 on 03/01/2023.  Received 1 dose of venofer  -Reviewed labs with Dr. Karie Schwalbe. [place]  -Recommend transfusing 1 unit pRBC -Risk/benefits of blood transfusion discussed with Orlando Penner consents to blood  transfusion  -Will check CBC and hgb cascade (not previously done) this afternoon    Gustavo Lah, CNM  03/05/2023 7:53 AM  [facility]

## 2023-03-06 LAB — TYPE AND SCREEN
ABO/RH(D): O POS
Antibody Screen: NEGATIVE
Unit division: 0

## 2023-03-06 LAB — BPAM RBC
Blood Product Expiration Date: 202411162359
ISSUE DATE / TIME: 202410190820
Unit Type and Rh: 5100

## 2023-03-09 ENCOUNTER — Ambulatory Visit: Payer: Medicaid Other

## 2023-03-09 ENCOUNTER — Other Ambulatory Visit: Payer: Medicaid Other

## 2023-03-09 DIAGNOSIS — O30039 Twin pregnancy, monochorionic/diamniotic, unspecified trimester: Secondary | ICD-10-CM

## 2023-03-16 ENCOUNTER — Ambulatory Visit: Payer: Medicaid Other

## 2023-03-16 ENCOUNTER — Other Ambulatory Visit: Payer: Medicaid Other

## 2023-03-23 ENCOUNTER — Ambulatory Visit: Payer: Medicaid Other

## 2023-03-23 ENCOUNTER — Other Ambulatory Visit: Payer: Medicaid Other

## 2024-02-23 ENCOUNTER — Ambulatory Visit

## 2024-05-25 ENCOUNTER — Ambulatory Visit
